# Patient Record
Sex: Male | Born: 1950 | Race: White | Hispanic: No | Marital: Married | Smoking: Former smoker
Health system: Southern US, Community
[De-identification: ages and names within clinical notes are randomized; demographics above are authoritative.]

## PROBLEM LIST (undated history)

## (undated) DIAGNOSIS — Q231 Congenital insufficiency of aortic valve: Secondary | ICD-10-CM

## (undated) DIAGNOSIS — R011 Cardiac murmur, unspecified: Secondary | ICD-10-CM

## (undated) DIAGNOSIS — N400 Enlarged prostate without lower urinary tract symptoms: Secondary | ICD-10-CM

## (undated) DIAGNOSIS — I4891 Unspecified atrial fibrillation: Secondary | ICD-10-CM

## (undated) HISTORY — DX: Benign prostatic hyperplasia without lower urinary tract symptoms: N40.0

## (undated) HISTORY — DX: Congenital insufficiency of aortic valve: Q23.1

## (undated) HISTORY — PX: TONSILLECTOMY: SUR1361

## (undated) HISTORY — DX: Unspecified atrial fibrillation: I48.91

## (undated) HISTORY — PX: TENDON REPAIR: SHX5111

## (undated) HISTORY — DX: Cardiac murmur, unspecified: R01.1

---

## 1971-03-05 HISTORY — PX: MENISCUS REPAIR: SHX5179

## 2001-06-14 HISTORY — PX: POLYPECTOMY: SHX149

## 2003-11-14 ENCOUNTER — Ambulatory Visit: Payer: Self-pay | Admitting: Internal Medicine

## 2004-01-01 ENCOUNTER — Ambulatory Visit: Payer: Self-pay | Admitting: Internal Medicine

## 2004-07-02 ENCOUNTER — Encounter: Payer: Self-pay | Admitting: Family Medicine

## 2005-02-24 ENCOUNTER — Ambulatory Visit: Payer: Self-pay | Admitting: Family Medicine

## 2005-03-30 ENCOUNTER — Ambulatory Visit: Payer: Self-pay | Admitting: Family Medicine

## 2005-05-06 ENCOUNTER — Ambulatory Visit: Payer: Self-pay | Admitting: Family Medicine

## 2005-08-21 ENCOUNTER — Ambulatory Visit: Payer: Self-pay | Admitting: Specialist

## 2005-09-30 ENCOUNTER — Ambulatory Visit: Payer: Self-pay | Admitting: Family Medicine

## 2005-10-06 ENCOUNTER — Ambulatory Visit: Payer: Self-pay | Admitting: Family Medicine

## 2005-10-13 LAB — HM COLONOSCOPY

## 2005-12-08 ENCOUNTER — Ambulatory Visit: Payer: Self-pay | Admitting: Gastroenterology

## 2006-01-12 ENCOUNTER — Ambulatory Visit: Payer: Self-pay | Admitting: Gastroenterology

## 2006-01-12 HISTORY — PX: UPPER GASTROINTESTINAL ENDOSCOPY: SHX188

## 2006-02-01 HISTORY — PX: COLONOSCOPY: SHX174

## 2006-02-11 HISTORY — PX: DOPPLER ECHOCARDIOGRAPHY: SHX263

## 2006-02-16 HISTORY — PX: OTHER SURGICAL HISTORY: SHX169

## 2006-07-12 ENCOUNTER — Ambulatory Visit: Payer: Self-pay | Admitting: Internal Medicine

## 2006-12-06 ENCOUNTER — Ambulatory Visit: Payer: Self-pay | Admitting: Internal Medicine

## 2007-01-03 ENCOUNTER — Ambulatory Visit: Payer: Self-pay | Admitting: Internal Medicine

## 2007-02-01 ENCOUNTER — Ambulatory Visit: Payer: Self-pay | Admitting: Internal Medicine

## 2008-07-29 ENCOUNTER — Encounter: Payer: Self-pay | Admitting: Family Medicine

## 2008-10-23 ENCOUNTER — Encounter: Payer: Self-pay | Admitting: Family Medicine

## 2008-10-23 DIAGNOSIS — D126 Benign neoplasm of colon, unspecified: Secondary | ICD-10-CM | POA: Insufficient documentation

## 2008-10-23 DIAGNOSIS — I4891 Unspecified atrial fibrillation: Secondary | ICD-10-CM | POA: Insufficient documentation

## 2008-10-23 DIAGNOSIS — N400 Enlarged prostate without lower urinary tract symptoms: Secondary | ICD-10-CM

## 2008-10-24 ENCOUNTER — Emergency Department: Payer: Self-pay | Admitting: Emergency Medicine

## 2008-10-24 ENCOUNTER — Encounter: Payer: Self-pay | Admitting: Family Medicine

## 2008-10-29 ENCOUNTER — Ambulatory Visit: Payer: Self-pay | Admitting: Family Medicine

## 2009-03-17 ENCOUNTER — Other Ambulatory Visit: Payer: Self-pay | Admitting: Internal Medicine

## 2009-08-12 ENCOUNTER — Encounter: Payer: Self-pay | Admitting: Family Medicine

## 2009-09-03 ENCOUNTER — Encounter (INDEPENDENT_AMBULATORY_CARE_PROVIDER_SITE_OTHER): Payer: Self-pay | Admitting: *Deleted

## 2009-09-04 ENCOUNTER — Ambulatory Visit: Payer: Self-pay | Admitting: Family Medicine

## 2009-09-04 DIAGNOSIS — L230 Allergic contact dermatitis due to metals: Secondary | ICD-10-CM

## 2009-09-04 DIAGNOSIS — M259 Joint disorder, unspecified: Secondary | ICD-10-CM | POA: Insufficient documentation

## 2009-09-08 ENCOUNTER — Ambulatory Visit: Payer: Self-pay | Admitting: Family Medicine

## 2009-09-08 DIAGNOSIS — M549 Dorsalgia, unspecified: Secondary | ICD-10-CM | POA: Insufficient documentation

## 2009-11-11 ENCOUNTER — Ambulatory Visit: Payer: Self-pay | Admitting: Internal Medicine

## 2009-12-04 ENCOUNTER — Ambulatory Visit: Payer: Self-pay | Admitting: Family Medicine

## 2009-12-04 DIAGNOSIS — R5383 Other fatigue: Secondary | ICD-10-CM

## 2009-12-04 DIAGNOSIS — R5381 Other malaise: Secondary | ICD-10-CM

## 2009-12-17 ENCOUNTER — Encounter: Payer: Self-pay | Admitting: Family Medicine

## 2010-03-03 NOTE — Letter (Signed)
Summary: Nadara Eaton letter  [place] [facility]  [address] [place], [place] [address]   Phone: [phone]  Fax: [phone]       09/03/2009 MRN: [id number]  MAIKA KACZMAREK [address] [place], [place]  [address]  Dear Mr. Lars [facility] [place], and [place] announce the retirement of Arta Silence, M.D., from full-time practice at the [facility] office effective July 31, 2009 and his plans of returning part-time.  It is important to Dr. Hetty Ely and to our practice that you understand that [facility] has seven physicians in our office for your health care needs.  We will continue to offer the same exceptional care that you have today.    Dr. Hetty Ely has spoken to many of you about his plans for retirement and returning part-time in the fall.   We will continue to work with you through the transition to schedule appointments for you in the office and meet the high standards that [place] is committed to.   Again, it is with great pleasure that we share the news that Dr. Hetty Ely will return to [facility] at [facility] in October of 2011 with a reduced schedule.    If you have any questions, or would like to request an appointment with one of our physicians, please call us at [phone] and press the option for Scheduling an appointment.  We take pleasure in providing you with excellent patient care and look forward to seeing you at your next office visit.  Our [facility] Physicians are:  Tillman Abide, M.D. Laurita Quint, M.D. Roxy Manns, M.D. Kerby Nora, M.D. Hannah Beat, M.D. Ruthe Mannan, M.D. We proudly welcomed Raechel Ache, M.D. and Eustaquio Boyden, M.D. to the practice in July/August 2011.  Sincerely,  [facility]

## 2010-03-03 NOTE — Assessment & Plan Note (Signed)
Summary: ? SPIDER BITE   Vital Signs:  Patient profile:   60 year old male Weight:      166 pounds Temp:     98.5 degrees F oral Pulse rate:   78 / minute Pulse rhythm:   regular BP sitting:   110 / 80  (left arm) Cuff size:   regular  Vitals Entered By: Selena Batten Dance CMA Duncan Dull) (November 11, 2009 4:01 PM) CC: check place on face   History of Present Illness: CC: check face  1 wk ago noticed brown spot on right cheek of face.  Yesterday brown spot fell off- scab.  did ooze some, clear fluid, no bleeding, no pus.  Has been using hot compress on.  + pruritis.  not bothering him otherwise.    Last 3 days having ST.  Today congestion.  No cough, no fevers.    Current Medications (verified): 1)  Multivitamins  Tabs (Multiple Vitamin) .... Take One By Mouth Daily 2)  Aspirin 81 Mg Tbec (Aspirin) .... Take 2 By Mouth Daily 3)  Ibuprofen 200 Mg Tabs (Ibuprofen) .... Take One By Mouth Two Times A Day As Needed 4)  Fish Oil 1000 Mg Caps (Omega-3 Fatty Acids) .... Take 3000 Mg As Needed 5)  Toprol Xl 25 Mg Xr24h-Tab (Metoprolol Succinate) .... Take 1/2 By Mouth Daily 6)  Vitamin D 1000 Unit Tabs (Cholecalciferol) .... 2-3 By Mouth Once Daily  Allergies: 1)  ! Penicillin V Potassium (Penicillin V Potassium) 2)  ! Avelox (Moxifloxacin Hcl)  Past History:  Past Medical History: Last updated: 10/23/2008 Benign prostatic hypertrophy PMH-FH-SH reviewed for relevance  Review of Systems       per HPI  Physical Exam  General:  Well-developed,well-nourished,in no acute distress; alert,appropriate and cooperative throughout examination Head:  Normocephalic and atraumatic without obvious abnormalities. No apparent alopecia or balding. Mild thinning of hair. Eyes:  No corneal or conjunctival inflammation noted. EOMI. Perrla.  Ears:  External ear exam shows no significant lesions or deformities.  Otoscopic examination reveals clear canals, tympanic membranes are intact bilaterally without  bulging, retraction, inflammation or discharge. Hearing is grossly normal bilaterally. Nose:  External nasal examination shows no deformity or inflammation. Nasal mucosa are pink and moist without lesions or exudates. Mouth:  Oral mucosa and oropharynx without lesions or exudates.  Teeth in good repair. Neck:  No deformities, masses, or tenderness noted.  no LAD Skin:  small red spot right cheek, no drainage, slight erythema surrounding lesion and spreading linearly inferiorly from lesion   Impression & Recommendations:  Problem # 1:  SKIN LESION (ICD-709.9) reassured, benign appearing.  likely redness irritation from scab removal.  rec continue to monitor for nwo, consider abx ointment while spot heals, warm compresses.  return if red flags.  Problem # 2:  VIRAL URI (ICD-465.9)  Instructed on symptomatic treatment. Call if symptoms persist or worsen.   His updated medication list for this problem includes:    Aspirin 81 Mg Tbec (Aspirin) .Marland Kitchen... Take 2 by mouth daily    Ibuprofen 200 Mg Tabs (Ibuprofen) .Marland Kitchen... Take one by mouth two times a day as needed  Complete Medication List: 1)  Multivitamins Tabs (Multiple vitamin) .... Take one by mouth daily 2)  Aspirin 81 Mg Tbec (Aspirin) .... Take 2 by mouth daily 3)  Ibuprofen 200 Mg Tabs (Ibuprofen) .... Take one by mouth two times a day as needed 4)  Fish Oil 1000 Mg Caps (Omega-3 fatty acids) .... Take 3000 mg as needed 5)  Toprol Xl 25 Mg Xr24h-tab (Metoprolol succinate) .... Take 1/2 by mouth daily 6)  Vitamin D 1000 Unit Tabs (Cholecalciferol) .... 2-3 by mouth once daily  Patient Instructions: 1)  you are doing all the right things.  continue warm compresses, consider abx ointment. 2)  For congestion - get plenty of rest, lots of fluids. 3)  Call us if not improving as expected.  Current Allergies (reviewed today): ! PENICILLIN V POTASSIUM (PENICILLIN V POTASSIUM) ! AVELOX (MOXIFLOXACIN HCL)

## 2010-03-03 NOTE — Assessment & Plan Note (Signed)
Summary: SOMETHING ON FOOT/DISCOLORATION ON NOSE/DLO   Vital Signs:  Patient profile:   60 year old male Height:      68.5 inches Weight:      164.0 pounds BMI:     24.66 Temp:     98.7 degrees F oral Pulse rate:   76 / minute Pulse rhythm:   regular BP sitting:   110 / 78  (left arm) Cuff size:   regular  Vitals Entered By: Benny Lennert CMA Duncan Dull) (September 04, 2009 8:29 AM)  History of Present Illness: Chief complaint multiple issues   Arch of left foot..present over past few years. For several years he has had soreness when putting pressure on it.  He coming in today beacuse it has gradually become more sore. No color change, no rash. Does not bother him much.   No recent change in activity.  Where glasses rest..has noted discoloration  and irritation. Intermittant. No itching..but burns some. No scaling/flaking. No family or personal history of skin problems or skin cancer.      Problems Prior to Update: 1)  Ruq Pain Radiating To Mid Back  (ICD-789.01) 2)  Paroxysmal Atrial Fibrillation  (ICD-427.31) 3)  Colonic Polyps  (ICD-211.3) 4)  Benign Prostatic Hypertrophy  (ICD-600.00)  Current Medications (verified): 1)  Multivitamins  Tabs (Multiple Vitamin) .... Take One By Mouth Daily 2)  Aspirin 81 Mg Tbec (Aspirin) .... Take 2 By Mouth Daily 3)  Ibuprofen 200 Mg Tabs (Ibuprofen) .... Take One By Mouth Two Times A Day As Needed 4)  Fish Oil 1000 Mg Caps (Omega-3 Fatty Acids) .... Take 3000 Mg As Needed 5)  Toprol Xl 25 Mg Xr24h-Tab (Metoprolol Succinate) .... Take 1/2 By Mouth Daily  Allergies: 1)  ! Penicillin V Potassium (Penicillin V Potassium) 2)  ! Avelox (Moxifloxacin Hcl)  Past History:  Past medical, surgical, family and social histories (including risk factors) reviewed, and no changes noted (except as noted below).  Past Medical History: Reviewed history from 10/23/2008 and no changes required. Benign prostatic hypertrophy  Past Surgical  History: Reviewed history from 10/23/2008 and no changes required. Tonsillectomy - at 61 years old Left little finger tendon repair at 60 years of age Left knee open medial meniscal repair 02/73 Colon polypectomy, int hemorrhoids 06/14/01 UGI - 01/12/06 ETT Myoview- mod inferior defect,  neg  ischemia 02/16/06 ECHO NML EF 60% 02/11/06  Family History: Reviewed history from 10/23/2008 and no changes required. Father:  Alive, S/P heart attack with bypass graft at 60 years of age, shortness of breath, is a retired Company secretary in [place] with no significant chest symptoms, and he has had generalized pruritus  Mother: Deceased at age of 60 with Alzheimer's, depression, and some complication for which she bled out in the hospital Siblings: Three brothers, all alive, one with bladder cancer, one with depression and one alive and well.  One sister alive and well CV + father, PGF deceased with MI HBP - none DM +  Niece Prostate cancer - bladder cancer + brother Colon Cancer + self polyp Depression + Mother, brother ETOH/Drug Abuse - none + Smoke PGM  Social History: Reviewed history from 10/23/2008 and no changes required. 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 CV:  Denies chest pain or discomfort. Resp:  Denies shortness of breath. GI:  Denies abdominal pain. GU:  Denies dysuria.  Physical Exam  General:  Well-developed,well-nourished,in no  acute distress; alert,appropriate and cooperative throughout examination Eyes:  No corneal or conjunctival inflammation noted. EOMI. Perrla. Funduscopic exam benign, without hemorrhages, exudates or papilledema. Vision grossly normal. Ears:  External ear exam shows no significant lesions or deformities.  Otoscopic examination reveals clear canals, tympanic membranes are intact bilaterally without bulging, retraction, inflammation or discharge.  Hearing is grossly normal bilaterally. Nose:  External nasal examination shows no deformity or inflammation. Nasal mucosa are pink and moist without lesions or exudates. Mouth:  Oral mucosa and oropharynx without lesions or exudates.  Teeth in good repair. Lungs:  Normal respiratory effort, chest expands symmetrically. Lungs are clear to auscultation, no crackles or wheezes. Heart:  Normal rate and regular rhythm. S1 and S2 normal without gallop, murmur, click, rub or other extra sounds. Pulses:  R and L posterior tibial pulses are full and equal bilaterally  Extremities:  no edmea  Skin:  1 cm nodule in central left plantar fascia.Marland Kitchennon mobile except when stretching plantar fascia  non tender  no redness  no skin discoloration  Right bridge of nse...glassess are missing nose guard on right, metal rough and worn...metal sitting on skin on right not on left area of dicoloration, redish purple at contact area.    Impression & Recommendations:  Problem # 1:  CONTACT DERMATITIS DUE TO METALS (ZOX-096.04) Apply OTC cortisone cream two times a day. Fix glasses or get new ones to stop contact against skin.   Problem # 2:  OTHER SYMPTOMS REFERABLE TO ANKLE AND FOOT JOINT (ICD-719.67) Nodule on plantar fascia...no sign of infection. Limiting pt minimally. If increasing in size or tenderness...refer to podiatry. Pt not interested in referral at this time.   Complete Medication List: 1)  Multivitamins Tabs (Multiple vitamin) .... Take one by mouth daily 2)  Aspirin 81 Mg Tbec (Aspirin) .... Take 2 by mouth daily 3)  Ibuprofen 200 Mg Tabs (Ibuprofen) .... Take one by mouth two times a day as needed 4)  Fish Oil 1000 Mg Caps (Omega-3 fatty acids) .... Take 3000 mg as needed 5)  Toprol Xl 25 Mg Xr24h-tab (Metoprolol succinate) .... Take 1/2 by mouth daily  Patient Instructions: 1)  Call if interested in podiatry referral.. 2)  Call if foot mass increasing in size or tenderness. 3)  Apply cortisone  10 to area on nose twice daily..for 2 weeks. 4)   Fix glasses so not resting on nose.  Current Allergies (reviewed today): ! PENICILLIN V POTASSIUM (PENICILLIN V POTASSIUM) ! AVELOX (MOXIFLOXACIN HCL)

## 2010-03-03 NOTE — Assessment & Plan Note (Signed)
Summary: ? kidney infection/nt   Vital Signs:  Patient profile:   60 year old male Height:      68.5 inches Weight:      164.6 pounds BMI:     24.75 Temp:     97.6 degrees F oral Pulse rate:   72 / minute Pulse rhythm:   regular BP sitting:   110 / 68  (left arm) Cuff size:   regular  Vitals Entered By: Benny Lennert CMA Duncan Dull) (September 08, 2009 12:30 PM)  History of Present Illness: Chief complaint Back pain  60 year old male:  On either side of spine, erects having some pain there  Last week, in the middle of his back, some pain getting in and out of his car. Took some advil. Over the weekend.   Out of the gym all week. Went to the gym    no dysuria  REVIEW OF SYSTEMS  GEN: No systemic complaints, no fevers, chills, sweats, or other acute illnesses MSK: Detailed in the HPI GI: tolerating PO intake without difficulty Neuro: No numbness, parasthesias, or tingling associated. Otherwise the pertinent positives of the ROS are noted above.    Allergies: 1)  ! Penicillin V Potassium (Penicillin V Potassium) 2)  ! Avelox (Moxifloxacin Hcl)  Past History:  Past medical, surgical, family and social histories (including risk factors) reviewed, and no changes noted (except as noted below).  Past Medical History: Reviewed history from 10/23/2008 and no changes required. Benign prostatic hypertrophy  Past Surgical History: Reviewed history from 10/23/2008 and no changes required. Tonsillectomy - at 60 years old Left little finger tendon repair at 60 years of age Left knee open medial meniscal repair 02/73 Colon polypectomy, int hemorrhoids 06/14/01 UGI - 01/12/06 ETT Myoview- mod inferior defect,  neg  ischemia 02/16/06 ECHO NML EF 60% 02/11/06  Family History: Reviewed history from 10/23/2008 and no changes required. Father:  Alive, S/P heart attack with bypass graft at 60 years of age, shortness of breath, is a retired Company secretary in [place] with no significant  chest symptoms, and he has had generalized pruritus  Mother: Deceased at age of 80 with Alzheimer's, depression, and some complication for which she bled out in the hospital Siblings: Three brothers, all alive, one with bladder cancer, one with depression and one alive and well.  One sister alive and well CV + father, PGF deceased with MI HBP - none DM +  Niece Prostate cancer - bladder cancer + brother Colon Cancer + self polyp Depression + Mother, brother ETOH/Drug Abuse - none + Smoke PGM  Social History: Reviewed history from 10/23/2008 and no changes required. 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  Ears, externally no deformities Pulm: Breathing comfortably in no respiratory distress Range of motion at  the waist: Flexion:no pain Extension: no pain Rotation: no pain  No echymosis or edema Rises to examination table with mild difficulty Gait: minimally antalgic  Inspection/Deformity: N Paraspinus T: Y  B Ankle Dorsiflexion (L5,4): 5/5 B Great Toe Dorsiflexion (L5,4): 5/5 Heel Walk (L5): WNL Toe Walk (S1): WNL Rise/Squat (L4): WNL  SENSORY B Medial Foot (L4): WNL B Dorsum (L5): WNL B Lateral (S1): WNL Light Touch: WNL Pinprick: WNL  REFLEXES Knee (L4): 2+ Ankle (S1): 2+  B SLR, seated: neg B SLR, supine: neg  B FABER: neg B Reverse FABER: neg B Greater Troch: NT B Log Roll: neg B Stork: NT B Sciatic Notch: NT Leg Lengths: equal    Impression & Recommendations:  Problem # 1:  BACK PAIN (ICD-724.5) >25 minutes spent in face to face time with patient, >50% spent in counselling or coordination of care : reviewed anatomy, discussion of rehab protocols from [facility] for  back and core and alteration of lifting routine.  His updated medication list for this problem includes:    Aspirin 81 Mg Tbec (Aspirin) .Marland Kitchen... Take 2 by mouth daily    Ibuprofen 200 Mg Tabs (Ibuprofen) .Marland Kitchen... Take one by mouth two times a day as needed  Complete Medication List: 1)  Multivitamins Tabs (Multiple vitamin) .... Take one by mouth daily 2)  Aspirin 81 Mg Tbec (Aspirin) .... Take 2 by mouth daily 3)  Ibuprofen 200 Mg Tabs (Ibuprofen) .... Take one by mouth two times a day as needed 4)  Fish Oil 1000 Mg Caps (Omega-3 fatty acids) .... Take 3000 mg as needed 5)  Toprol Xl 25 Mg Xr24h-tab (Metoprolol succinate) .... Take 1/2 by mouth daily  Current Allergies (reviewed today): ! PENICILLIN V POTASSIUM (PENICILLIN V POTASSIUM) ! AVELOX (MOXIFLOXACIN HCL)

## 2010-03-03 NOTE — Assessment & Plan Note (Signed)
Summary: FREQUENT URINATION @ NIGHT/CLE   Vital Signs:  Patient profile:   60 year old male Weight:      163 pounds Temp:     98.0 degrees F oral Pulse rate:   60 / minute Pulse rhythm:   irregular BP sitting:   110 / 70  (left arm) Cuff size:   regular  Vitals Entered By: Sydell Axon LPN (December 04, 2009 11:13 AM) CC: Frequent urination at night   History of Present Illness: Pt here for progressive urination at night. He has PSA checked regularly at [facility] due to his wife's employment. He gets up at night often at times. He had fatigue, lack of enthusiasm, some libido issues and some erectile difficulties at times. His last PE was here in early 08. He is healthy and active and prefers not to have a PE.  He is amenable to bloddwork through [facility]. 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 Take One By Mouth Daily 2)  Aspirin 81 Mg Tbec (Aspirin) .... Take 2 By Mouth Daily 3)  Ibuprofen 200 Mg Tabs (Ibuprofen) .... Take One By Mouth Two Times A Day As Needed 4)  Fish Oil 1000 Mg Caps (Omega-3 Fatty Acids) .... Take 3000 Mg As Needed 5)  Toprol Xl 25 Mg Xr24h-Tab (Metoprolol Succinate) .... Take 1/2 By Mouth Daily 6)  Vitamin D 1000 Unit Tabs (Cholecalciferol) .... 2-3 By Mouth Once Daily  Allergies: 1)  ! Penicillin V Potassium (Penicillin V Potassium) 2)  ! Avelox (Moxifloxacin Hcl)  Physical Exam  General:  Well-developed,well-nourished,in no acute distress; alert,appropriate and  cooperative throughout examination Head:  Normocephalic and atraumatic without obvious abnormalities. No apparent alopecia or balding. Mild thinning of hair. Eyes:  No corneal or conjunctival inflammation noted. EOMI. Perrla.  Ears:  External ear exam shows no significant lesions or deformities.  Otoscopic examination reveals clear canals, tympanic membranes are intact bilaterally without bulging, retraction, inflammation or discharge. Hearing is grossly normal bilaterally. Nose:  External nasal examination shows no deformity or inflammation. Nasal mucosa are pink and moist without lesions or exudates. Mouth:  Oral mucosa and oropharynx without lesions or exudates.  Teeth in good repair. Neck:  No deformities, masses, or tenderness noted.  no LAD Chest Wall:  No deformities, masses, tenderness or gynecomastia noted. Lungs:  Normal respiratory effort, chest expands symmetrically. Lungs are clear to auscultation, no crackles or wheezes. Heart:  Normal rate and regular rhythm. S1 and S2 normal without gallop, murmur, click, rub or other extra sounds. Rectal:  No external abnormalities noted. Normal sphincter tone. No rectal masses or tenderness. G neg. Genitalia:  Testes bilaterally descended without nodularity, tenderness or masses. No scrotal masses or lesions. No penis lesions or urethral discharge. Prostate:  Prostate gland firm and smooth, raphe intact, no enlargement, nodularity, tenderness, mass, asymmetry or induration. 10-20gms.   Impression & Recommendations:  Problem # 1:  BENIGN PROSTATIC HYPERTROPHY, WITH MILD URINARY OBSTRUCTION (ICD-600.01) Assessment New Discussed this  at length, including sxs, trmyt, needs, cancer risk. Does not seem to require trmt at this time.  Problem # 2:  FATIGUE (ICD-780.79) Assessment: New Constellation of fatigue, libido problems, erection difficulties, enthusiasm, etc....sounds like test deficiency. Will test but refer if trmt needed and  desired.  Complete Medication List: 1)  Multivitamins Tabs (Multiple vitamin) .... Take one by mouth daily 2)  Aspirin 81 Mg Tbec (Aspirin) .... Take 2 by mouth daily 3)  Ibuprofen 200 Mg Tabs (Ibuprofen) .... Take one by mouth two times a day as needed 4)  Fish Oil 1000 Mg Caps (Omega-3 fatty acids) .... Take 3000 mg as needed 5)  Toprol Xl 25 Mg Xr24h-tab (Metoprolol succinate) .... Take 1/2 by mouth daily 6)  Vitamin D 1000 Unit Tabs (Cholecalciferol) .... 2-3 by mouth once daily  Patient Instructions: 1)  Pt taking labs to [facility]. Will call with results. 2)  If test repl needed and desired, refer to Urology.   Orders Added: 1)  Est. Patient Level III [[address]]    Current Allergies (reviewed today): ! PENICILLIN V POTASSIUM (PENICILLIN V POTASSIUM) ! AVELOX (MOXIFLOXACIN HCL)

## 2010-06-09 ENCOUNTER — Encounter: Payer: Self-pay | Admitting: Family Medicine

## 2010-06-10 ENCOUNTER — Ambulatory Visit (INDEPENDENT_AMBULATORY_CARE_PROVIDER_SITE_OTHER): Payer: PRIVATE HEALTH INSURANCE | Admitting: Family Medicine

## 2010-06-10 ENCOUNTER — Encounter: Payer: Self-pay | Admitting: Family Medicine

## 2010-06-10 VITALS — BP 106/70 | HR 80 | Temp 98.3°F | Wt 167.1 lb

## 2010-06-10 DIAGNOSIS — R109 Unspecified abdominal pain: Secondary | ICD-10-CM | POA: Insufficient documentation

## 2010-06-10 NOTE — Progress Notes (Signed)
Last 2 mornings with upper abd pain. Epigastrum was ttp.  Pain would radiate upward and outward with deep breath.  Already sleeping with head elevated.  Pain resolved after getting up and moving.    1-2 weeks before 'my stomach didn't feel right.'  No change in BMs except for inc in stool number/day.  Prev with longstanding GI sx.  Had seen GI, colonoscopy/EGD done 01/12/06, unremarkable.    No FVD. Very rare nausea over the last month, brief, self resolved.  No weight loss, food aversion, hematochezia, hematemesis.   On ASA for AF.  No other NSAIDS.    Meds, vitals, and allergies reviewed.   ROS: See HPI.  Otherwise, noncontributory.  nad ncat Mmm Neck supple rrr ctab abd soft, epigastrum and RUQ minimally ttp, o/w not ttp, no rebound

## 2010-06-10 NOTE — Patient Instructions (Signed)
I would take some tums in the meantime.  If the pain persists, call back and ask for [facility] about scheduling the ultrasound of your abdomen.  Take care.

## 2010-06-10 NOTE — Assessment & Plan Note (Addendum)
>  25 min spent with face to face with patient.  D/w pt about options.  I would take prn tums for likely gerd and call back if needed.  We can set up abd u/s to eval gall bladder, though I don't expect sig pathology.  Hold on labs for now as exam is benign.  He agrees.

## 2010-06-11 ENCOUNTER — Telehealth: Payer: Self-pay | Admitting: Family Medicine

## 2010-06-11 DIAGNOSIS — R109 Unspecified abdominal pain: Secondary | ICD-10-CM

## 2010-06-11 NOTE — Assessment & Plan Note (Signed)
Send for u/s.

## 2010-06-11 NOTE — Telephone Encounter (Signed)
Abd [place] set up at [facility] on 06/12/2010 signed order was faxed on 06/11/2010. Also spoke with scheduler from [facility]. Patient called and aware of the appt. MK

## 2010-06-11 NOTE — Telephone Encounter (Signed)
Dr Para March, This patient called me to ask you to put an order in for an Abdominal US to be done at [facility] which will be an external order. The number to call is [phone] and their fax is [phone]. He wanted to get this done tomorrow am if he could. Please place the order and let me know when you do. His cell# [phone]. Shirlee Limerick

## 2010-06-12 ENCOUNTER — Telehealth: Payer: Self-pay | Admitting: Family Medicine

## 2010-06-12 DIAGNOSIS — R109 Unspecified abdominal pain: Secondary | ICD-10-CM

## 2010-06-12 NOTE — Telephone Encounter (Signed)
Call from [facility]- GB wall thickened but no CBD diliation.  No stones.  D/w pt.  No fever.  Pain is some better now.  If fever or sig inc in pain, then to ER.  Avoid fatty meals in meantime and proceed with HIDA scan.  He understood and agreed.

## 2010-06-12 NOTE — Telephone Encounter (Signed)
Called Dr. Vear Clock regarding call report and she stated that she has already spoken with Dr. Para March.

## 2010-06-15 ENCOUNTER — Other Ambulatory Visit: Payer: Self-pay | Admitting: Family Medicine

## 2010-06-15 ENCOUNTER — Other Ambulatory Visit: Payer: Self-pay | Admitting: Internal Medicine

## 2010-06-15 ENCOUNTER — Inpatient Hospital Stay (HOSPITAL_COMMUNITY): Admission: RE | Admit: 2010-06-15 | Payer: PRIVATE HEALTH INSURANCE | Source: Ambulatory Visit

## 2010-06-15 DIAGNOSIS — R932 Abnormal findings on diagnostic imaging of liver and biliary tract: Secondary | ICD-10-CM

## 2010-06-16 NOTE — Letter (Signed)
February 01, 2007    Don Warner D. Juliann Pares, MD.  [address], [address].  [place], [place], [address].   RE:  ACXEL, DINGEE  MRN:  [id number]  /  DOB:  07-13-1950   Dear Gerda Diss:   It was a pleasure to see your patient, Don Warner, today in  consultation regarding his atrial fibrillation.  As you recall, I had  had the pleasure of seeing him for you about three or four years ago for  atrial fibrillation, and his episodes have become increasingly frequent.  They are associated with impaired exercise tolerance, shortness of  breath, and a weak heart.  There is no significant chest discomfort.   His thromboembolic risk factors are broadly negative.   He works as a Systems analyst and had noted no change in his exercise  tolerance apart from the above.   His past medical history in addition to the above is notable for a  Dupuytren's contracture for which he anticipates undergoing surgery.  He  has also had surgery on his left knee.   He also describes an atypical pain that goes fleetingly into his neck.  You undertook carotid Doppler's and they apparently were negative.   His medications include aspirin, fish oil, and creatine.   He is ALLERGIC TO AVELOX AND PENICILLIN.   He is married, he has three children, 91, 58, and 57, he does not use  cigarettes, alcohol, or recreational drugs.   On examination, he is a middle-aged, Caucasian male appearing his stated  age of 88.  His blood pressure was 106/69, his pulse was 59, his weight  was 164.  His HEENT exam demonstrated no icterus or xanthomata.  The  neck veins were flat.  The carotids were brisk and full bilaterally  without bruits.  The back was without kyphosis or scoliosis.  His lungs  were clear.  Heart sounds were regular.  There was an early systolic  murmur.  There was no S4.  The abdomen was soft with active bowel sounds  without midline pulsation or hepatomegaly.  Femoral pulses were 2+,  distal pulses were intact,  there was no clubbing, cyanosis, or edema.  The neurological exam was grossly normal.  His skin was warm and dry.   Electrocardiogram dated today demonstrated sinus rhythm at 51 with  intervals of .18/.08/.41, the axis was 80 degrees, there was a prominent  P-wave in lead V1 consistent with left atrial enlargement.   IMPRESSION:  1. Paroxysmal atrial fibrillation.  2. Bradycardia.   DISCUSSION:  Don Warner, Mr. Massing has paroxysmal atrial fibrillation,  which is increasingly frequent and increasingly long so that it is  disrupting his life quite considerably.  Treatment options would include  AV nodal blocking agents and/or antiarrhythmic drug therapy typically as  a first step.  Both of these, particularly because once the agent  requires the concomitant use of an AV nodal blocking agent likely  aggravates his tendency towards bradycardia.   His primary question is whether he is a candidate for catheter ablation  of his atrial fibrillation.  I reviewed with him the procedure and  guidelines, which suggest that it is currently recommended to follow  antiarrhythmic drugs.  However, given his bradycardia and the likelihood  that the addition of an antiarrhythmic drug would result in untoward  consequences, I think it is reasonable to have him see one of the atrial  fibrillation ablation doctors as a possible candidate for primary  therapy.   I told him I  would touch base with Dr. Rhona Leavens at [facility], who could  review with him both the institution's data as well as his own  experience and think with the patient as to whether he would be  appropriate to consider for ablation bypassing the other drug trial.  Tikosyn as a drug  would be one that would not likely affect his AV node or his  bradycardia, and it may be that this would be recommended; we will have  to wait and see.   Don Warner, hope you had a Happy New Year.    Sincerely,      Duke Salvia, MD, [facility]  Electronically  Signed    SCK/MedQ  DD: 02/01/2007  DT: 02/01/2007  Job #: [id number]   CC:    Arta Silence, MD

## 2010-06-23 ENCOUNTER — Encounter: Payer: Self-pay | Admitting: Family Medicine

## 2010-06-30 ENCOUNTER — Telehealth: Payer: Self-pay | Admitting: *Deleted

## 2010-06-30 NOTE — Telephone Encounter (Signed)
Patient says that he went for his HIDA scan on 5-18 and he still hasn't heard back.

## 2010-07-01 NOTE — Telephone Encounter (Signed)
Call placed to patient at [phone], he was informed per Dr Para March instructions.  Patient states that he is aware that is may happen again in the future, but currently his symptoms have resolved. He states that in the event that he may need a surgeon he has one line up Dr Rosalita Levan in [place]. He stated that this is someone that he personally knows and has consulted with him regarding his symptoms.(his wife works for this physician). He declines a referral at this time and has decided to follow up on a as needed basis

## 2010-07-01 NOTE — Telephone Encounter (Signed)
I call [facility] and they are going to be faxing over the results.

## 2010-07-01 NOTE — Telephone Encounter (Signed)
Please call pt.  I am unsure why this result was held up.  The scan was normal.  This is reassuring.  It is not mandatory for him to have his gallbladder removed.  It may be reasonable to have patient consider outpatient follow up with general surgery, to discuss the options of gall bladder surgery.  The other option would be to continue to follow this clinically (ie as long as he has no pain, then we don't have to do anything else).  I think it is reasonable to do either at this point.  Please let me know if I need to put in the referral for general surgery.  Thanks.

## 2010-07-03 ENCOUNTER — Encounter: Payer: Self-pay | Admitting: Internal Medicine

## 2010-10-08 ENCOUNTER — Encounter: Payer: Self-pay | Admitting: Internal Medicine

## 2010-10-08 ENCOUNTER — Ambulatory Visit (INDEPENDENT_AMBULATORY_CARE_PROVIDER_SITE_OTHER): Payer: PRIVATE HEALTH INSURANCE | Admitting: Internal Medicine

## 2010-10-08 VITALS — BP 121/85 | HR 94 | Temp 97.6°F | Resp 16 | Ht 70.0 in | Wt 165.5 lb

## 2010-10-08 DIAGNOSIS — D126 Benign neoplasm of colon, unspecified: Secondary | ICD-10-CM

## 2010-10-08 DIAGNOSIS — R5383 Other fatigue: Secondary | ICD-10-CM

## 2010-10-08 DIAGNOSIS — R5381 Other malaise: Secondary | ICD-10-CM

## 2010-10-08 DIAGNOSIS — N400 Enlarged prostate without lower urinary tract symptoms: Secondary | ICD-10-CM

## 2010-10-08 DIAGNOSIS — I4891 Unspecified atrial fibrillation: Secondary | ICD-10-CM

## 2010-10-08 NOTE — Progress Notes (Signed)
  Subjective:    Patient ID: Don Warner, male    DOB: 21-Sep-1950, 60 y.o.   MRN: [id number]  HPI Mr. Garden is a healthy 60 yo male referred by wife Bonita Quin for establishment of primary care.  No acute issues,  But remains frustrated with his inability to perform physically at the level he used to prior to his diagnosis of atrial fibrillation.  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 Has occasional palpitations but denies fluid retention, orthopnea and chest pain .     Review of Systems  Constitutional: Negative for fever, chills, diaphoresis, activity change, appetite change, fatigue and unexpected weight change.  HENT: Negative for hearing loss, ear pain, nosebleeds, congestion, sore throat, facial swelling, rhinorrhea, sneezing, drooling, mouth sores, trouble swallowing, neck pain, neck stiffness, dental problem, voice change, postnasal drip, sinus pressure, tinnitus and ear discharge.   Eyes: Negative for photophobia, pain, discharge, redness, itching and visual disturbance.  Respiratory: Negative for apnea, cough, choking, chest tightness, shortness of breath, wheezing and stridor.   Cardiovascular: Positive for palpitations. Negative for chest pain and leg swelling.  Gastrointestinal: Negative for nausea, vomiting, abdominal pain, diarrhea, constipation, blood in stool, abdominal distention, anal bleeding and rectal pain.  Genitourinary: Negative for dysuria, urgency, frequency, hematuria, flank pain, decreased urine volume, scrotal swelling, difficulty urinating and testicular pain.  Musculoskeletal: Negative for myalgias, back pain, joint swelling, arthralgias and gait problem.  Skin: Negative for color change, rash and wound.  Neurological: Negative for dizziness, tremors, seizures, syncope, speech difficulty, weakness, light-headedness, numbness and headaches.  Psychiatric/Behavioral: Negative  for suicidal ideas, hallucinations, behavioral problems, confusion, sleep disturbance, dysphoric mood, decreased concentration and agitation. The patient is not nervous/anxious.        Objective:   Physical Exam  Constitutional: He is oriented to person, place, and time.  HENT:  Head: Normocephalic and atraumatic.  Mouth/Throat: Oropharynx is clear and moist.  Eyes: Conjunctivae and EOM are normal.  Neck: Normal range of motion. Neck supple. No JVD present. No thyromegaly present.  Cardiovascular: Normal rate and normal heart sounds.  An irregularly irregular rhythm present.  Pulmonary/Chest: Effort normal and breath sounds normal. He has no wheezes. He has no rales.  Abdominal: Soft. Bowel sounds are normal. He exhibits no mass. There is no tenderness. There is no rebound.  Musculoskeletal: Normal range of motion. He exhibits no edema.  Neurological: He is alert and oriented to person, place, and time.  Skin: Skin is warm and dry.  Psychiatric: He has a normal mood and affect.          Assessment & Plan:

## 2010-10-09 NOTE — Assessment & Plan Note (Addendum)
With history of polypectomy in 2003.  Reportedly Up to date on surveillance but need records.

## 2010-10-09 NOTE — Assessment & Plan Note (Addendum)
Not requring medications.  PSA is due

## 2010-10-09 NOTE — Assessment & Plan Note (Signed)
Controlled with low dose metoprolol, occasionally aggravated by inherent bradycardia due to prior excellent physical conditioning.  He does not desire anticoagulation due to his active lifestyle, but takes 2 aspirin daily.

## 2010-11-04 ENCOUNTER — Telehealth: Payer: Self-pay | Admitting: Internal Medicine

## 2010-11-04 ENCOUNTER — Other Ambulatory Visit: Payer: Self-pay | Admitting: Internal Medicine

## 2010-11-04 NOTE — Telephone Encounter (Signed)
Cell [phone] Pt would like to get his vit d level checked.  He has an order for [facility] order date 10/08/10 Pt is going to get these labs done at [facility] in   Please advise pt

## 2010-11-12 ENCOUNTER — Telehealth: Payer: Self-pay | Admitting: Internal Medicine

## 2010-11-12 NOTE — Telephone Encounter (Signed)
c [phone]  Please call when the paper work for [facility] is done so the [facility] can go to [facility] to get labs  He left this last week

## 2010-11-17 NOTE — Telephone Encounter (Signed)
Left message for patient to return my call.

## 2010-11-18 ENCOUNTER — Other Ambulatory Visit: Payer: Self-pay | Admitting: Internal Medicine

## 2010-11-18 NOTE — Telephone Encounter (Signed)
Patient came in and picked up orders for labs.

## 2010-12-16 ENCOUNTER — Telehealth: Payer: Self-pay | Admitting: Internal Medicine

## 2010-12-16 MED ORDER — ERGOCALCIFEROL 1.25 MG (50000 UT) PO CAPS: 50000.0000 [IU] | ORAL_CAPSULE | ORAL | Status: DC

## 2010-12-16 NOTE — Telephone Encounter (Signed)
His vitamin d was low at 24 (rx Drisdol 50,000 units once a week for one month, then start otc vit d 1000 units daily.  I wil update EPIC withrx.   his other labs were normal including thyroid

## 2010-12-16 NOTE — Telephone Encounter (Signed)
Notified patient of results 

## 2010-12-23 ENCOUNTER — Encounter: Payer: Self-pay | Admitting: Internal Medicine

## 2011-04-16 ENCOUNTER — Ambulatory Visit (INDEPENDENT_AMBULATORY_CARE_PROVIDER_SITE_OTHER): Payer: PRIVATE HEALTH INSURANCE | Admitting: Internal Medicine

## 2011-04-16 ENCOUNTER — Encounter: Payer: Self-pay | Admitting: Internal Medicine

## 2011-04-16 VITALS — BP 110/74 | HR 86 | Temp 98.1°F | Resp 16 | Wt 170.5 lb

## 2011-04-16 DIAGNOSIS — M25561 Pain in right knee: Secondary | ICD-10-CM

## 2011-04-16 DIAGNOSIS — M25569 Pain in unspecified knee: Secondary | ICD-10-CM

## 2011-04-16 NOTE — Progress Notes (Deleted)
  Subjective:    Patient ID: Don Warner, male    DOB: December 05, 1950, 61 y.o.   MRN: [id number]  HPI  10 day history of right knee pain ,  Limited flexion,  Fullness/bulge in the popliteal space ,  Has  Been avoiding squats and lunges. Not giving way .   Occasional pain on the lateral side     Review of Systems     Objective:   Physical Exam        Assessment & Plan:

## 2011-04-16 NOTE — Patient Instructions (Addendum)
You can increase your ibuprofen to 400  Or 600 mg up to every 8 hours .,  ice your knee for 15   minutes    We will schedule an ultrasound to rule out a baker's cyst

## 2011-04-18 ENCOUNTER — Encounter: Payer: Self-pay | Admitting: Internal Medicine

## 2011-04-18 NOTE — Progress Notes (Signed)
Patient ID: Don Warner, male   DOB: [birth year], 61 y.o.   MRN: [id number]  Patient Active Problem List  Diagnoses  . COLONIC POLYPS  . PAROXYSMAL ATRIAL FIBRILLATION  . BENIGN PROSTATIC HYPERTROPHY  . CONTACT DERMATITIS DUE TO METALS  . OTHER SYMPTOMS REFERABLE TO ANKLE AND FOOT JOINT  . BACK PAIN  . FATIGUE  . Abdominal  pain, other specified site    Subjective:  CC:   Chief Complaint  Patient presents with  . Knee Pain    right    HPI:   Don Warner is a 61 y.o. male who presents 10 day history of right knee pain.  He notes Limited flexion and a cessation of Fullness/bulge in the popliteal space.  He works out daily but has been avoiding  [employer] and lunges to aggravate his pain he denies a sensation of the knee giving way.  He notes occasional pain on the lateral side  the knee which radiates to the lateral calf but does not think it is associated with activity. He denies any numbness or tingling. He has had no recent travel or prolonged immobilization. He is not a long-distance runner. He tried taking one ibuprofen which did ameliorate the pain somewhat.   Review of Systems:  Pertinent review of systems addressed in the HPI,    The following portions of the patient's history were reviewed and updated as appropriate: Allergies, current medications, and problem list.  Past Medical History  Diagnosis Date  . BPH (benign prostatic hypertrophy)   . Bicuspid aortic valve   . Arrhythmia     chronic atrial fibrillation   Past Surgical History  Procedure Date  . Tonsillectomy 8 YOA  . Tendon repair 10 YOA    Left little finger  . Meniscus repair 03/1971    Medial, left knee open  . Polypectomy 06/14/2001    Colon, internal hemorrhoids  . Upper gastrointestinal endoscopy 01/12/06  . Ett myoview 02/16/2006    Mod inferior defect, neg ischemia  . Doppler echocardiography 02/11/06    Nml  EF 60%    History   Social History  . Marital Status: Married   Spouse Name: N/A    Number of Children: 3  . Years of Education: N/A   Occupational History  . 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 Smoking status: Former Smoker    Quit date: 10/07/1973  . Smokeless tobacco: Never Used  . Alcohol Use: Yes     wine daily, social drinker  . Drug Use: No  . Sexually Active: Not on file   Other Topics Concern  . Not on file   Social History Narrative  . No narrative on file     Objective:  BP 110/74  Pulse 86  Temp(Src) 98.1 F (36.7 C) (Oral)  Resp 16  Wt 170 lb 8 oz (77.338 kg)  SpO2 96%  General:  Well-developed, well-nourished. No acute distress General appearance: alert, cooperative and appears stated age Back: symmetric, no curvature. ROM normal. No CVA tenderness. Lungs: clear to auscultation bilaterally Chest wall: no tenderness Heart: regular rate and rhythm, S1, S2 normal, no murmur, click, rub or gallop Abdomen: soft, non-tender; bowel sounds normal; no masses,  no organomegaly Extremities: extremities normal, atraumatic, no cyanosis or edema, Homans sign is negative, no sign of DVT and no edema, redness or tenderness in the calves or thighs Pulses: 2+ and symmetric   Assessment:  1. Knee pain, right  Lower Extremity Venous Duplex Right  He has mild fullness in the popliteal fossa and I wonder whether he has a Baker's cyst which may be interfering with his range of motion on that side.  Plan:  Orders Placed This Encounter  Procedures  . Lower Extremity Venous Duplex Right    No Follow-up on file.   Medications:   Current Outpatient Prescriptions  Medication Sig Dispense Refill  . aspirin 81 MG tablet Take 81 mg by mouth daily.

## 2011-05-05 ENCOUNTER — Telehealth: Payer: Self-pay | Admitting: Internal Medicine

## 2011-05-05 NOTE — Telephone Encounter (Signed)
Patient came in the office very upset that he has not received his ultrasound results of his knee.  He stated this is unacceptable because he doesn't know what is wrong with him.  He wanted to know "who dropped the ball" with his results because the imaging center in [place] said they faxed the results the same day as the ultrasound and that was on 3/21.  He stated "if it is the imaging centers fault I will not go there anymore, and if its this offices fault he might not come here anymore."  I tried to explain to him that I have not seen the results and they are not in his chart, but I would check with Dr. Darrick Huntsman to see if she has seen them, and if she has not I will request the results from the Imaging center.   He was very upset when he left.  I told him I would call him back.

## 2011-05-05 NOTE — Telephone Encounter (Signed)
Patient notified.  ultrasound of popliteal  Space right side was normal.  Tirad sent initial report to [facility] which is why we did not receive it,  Patient very aapologetic about his demeanor to you .

## 2011-05-05 NOTE — Telephone Encounter (Signed)
Call [facility].  This result was faxed to [facility].  They changed your phone/fax to the correct #.  Laid results in your chair

## 2011-05-05 NOTE — Telephone Encounter (Signed)
I spoke with patient.  Please call [facility] and get the ultrasoundthat was done 3 weeks ago on Don Warner that I ordered.  I HAVE NOT received it.

## 2011-05-07 ENCOUNTER — Ambulatory Visit (INDEPENDENT_AMBULATORY_CARE_PROVIDER_SITE_OTHER): Payer: PRIVATE HEALTH INSURANCE | Admitting: Internal Medicine

## 2011-05-07 ENCOUNTER — Encounter: Payer: Self-pay | Admitting: Internal Medicine

## 2011-05-07 VITALS — BP 100/60 | HR 86 | Temp 98.3°F | Resp 18 | Wt 168.0 lb

## 2011-05-07 DIAGNOSIS — J40 Bronchitis, not specified as acute or chronic: Secondary | ICD-10-CM

## 2011-05-07 DIAGNOSIS — J069 Acute upper respiratory infection, unspecified: Secondary | ICD-10-CM

## 2011-05-07 MED ORDER — AZITHROMYCIN 500 MG PO TABS: 500.0000 mg | ORAL_TABLET | Freq: Every day | ORAL | Status: DC

## 2011-05-07 MED ORDER — PREDNISONE (PAK) 10 MG PO TABS: ORAL_TABLET | ORAL | Status: AC

## 2011-05-07 NOTE — Progress Notes (Signed)
Patient ID: Don Warner, male   DOB: 10/05/1950, 61 y.o.   MRN: [id number] Patient Active Problem List  Diagnoses  . COLONIC POLYPS  . PAROXYSMAL ATRIAL FIBRILLATION  . BENIGN PROSTATIC HYPERTROPHY  . CONTACT DERMATITIS DUE TO METALS  . OTHER SYMPTOMS REFERABLE TO ANKLE AND FOOT JOINT  . BACK PAIN  . FATIGUE  . Abdominal  pain, other specified site  . URI, acute    Subjective:  CC:   Chief Complaint  Patient presents with  . Sore Throat  . Cough    HPI:   Don Warner a 61 y.o. male who presents  With a two day history of sore throat,  productive cough , and sinus congestion without fevers or facial pain.  He has been taking generic mucinex and an OTC cough syrup.  NO imporvement,  Feels drained, no muscle aches.     Past Medical History  Diagnosis Date  . BPH (benign prostatic hypertrophy)   . Bicuspid aortic valve   . Arrhythmia     chronic atrial fibrillation    Past Surgical History  Procedure Date  . Tonsillectomy 8 YOA  . Tendon repair 10 YOA    Left little finger  . Meniscus repair 03/1971    Medial, left knee open  . Polypectomy 06/14/2001    Colon, internal hemorrhoids  . Upper gastrointestinal endoscopy 01/12/06  . Ett myoview 02/16/2006    Mod inferior defect, neg ischemia  . Doppler echocardiography 02/11/06    Nml  EF 60%         The following portions of the patient's history were reviewed and updated as appropriate: Allergies, current medications, and problem list.    Review of Systems:   12 Pt  review of systems was negative except those addressed in the HPI,     History   Social History  . Marital Status: Married    Spouse Name: N/A    Number of Children: 3  . Years of Education: N/A   Occupational History  . 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 Smoking status: Former Smoker    Quit date: 10/07/1973  . Smokeless tobacco: Never Used  . Alcohol Use: Yes     wine daily, social drinker  .  Drug Use: No  . Sexually Active: Not on file   Other Topics Concern  . Not on file   Social History Narrative  . No narrative on file    Objective:  BP 100/60  Pulse 86  Temp(Src) 98.3 F (36.8 C) (Oral)  Resp 18  Wt 168 lb (76.204 kg)  SpO2 97%  General appearance: alert, cooperative and appears stated age Ears: normal TM's and external ear canals both ears Throat: lips, mucosa, and tongue normal; teeth and gums normal Neck: no adenopathy, no carotid bruit, supple, symmetrical, trachea midline and thyroid not enlarged, symmetric, no tenderness/mass/nodules Back: symmetric, no curvature. ROM normal. No CVA tenderness. Lungs: clear to auscultation bilaterally Heart: regular rate and rhythm, S1, S2 normal, no murmur, click, rub or gallop Abdomen: soft, non-tender; bowel sounds normal; no masses,  no organomegaly Pulses: 2+ and symmetric Skin: Skin color, texture, turgor normal. No rashes or lesions Lymph nodes: Cervical, supraclavicular, and axillary nodes normal.  Assessment and Plan:  URI, acute Currently viral .  Advised to take generic OTC benadryl 25 mg evwry 8 hours for the drainage,  Sudafed PE  10 to 20 mg every 6 hours for the congestion,  and flushes sinuses twice daily with Simply Saline.   Add abx and prednisone if no improvement in 3 to 5 days or if signs and sx of bacterial sinusitis or otitis develop.     Updated Medication List Outpatient Encounter Prescriptions as of 05/07/2011  Medication Sig Dispense Refill  . aspirin 81 MG tablet Take 81 mg by mouth daily.        . metoprolol tartrate (LOPRESSOR) 25 MG tablet Take 12.5 mg by mouth 2 (two) times daily.      Marland Kitchen azithromycin (ZITHROMAX) 500 MG tablet Take 1 tablet (500 mg total) by mouth daily.  7 tablet  0  . predniSONE (STERAPRED UNI-PAK) 10 MG tablet 6 tablets on Day 1 , then reduce by 1 tablet daily until gone  21 tablet  0     No orders of the defined types were placed in this encounter.    No  Follow-up on file.

## 2011-05-07 NOTE — Patient Instructions (Addendum)
You have a viral  Syndrome .  The post nasal drip is causing your sore throat.  Lavage your sinuses twice daily with Simply saline nasal spray.  Use benadryl 25 mg every 8 hours and PE 10 to 30 every 8 hours to manage the drainage and congestion.  Gargle with salt water often for the sore throat.  Continue Robitussin cough syrup (has codeine) for the cough.  If the throat is no better  In 3 to 4 days OR  if you develop T > 100.4,  Green nasal discharge,  Or facial pain,  Start the antibiotic.  If your chest feels tighter or if you start wheezing, start the prednisone and the antibiotic.

## 2011-05-09 ENCOUNTER — Encounter: Payer: Self-pay | Admitting: Internal Medicine

## 2011-05-09 DIAGNOSIS — J069 Acute upper respiratory infection, unspecified: Secondary | ICD-10-CM | POA: Insufficient documentation

## 2011-05-09 NOTE — Assessment & Plan Note (Addendum)
Currently viral .  Advised to take generic OTC benadryl 25 mg evwry 8 hours for the drainage,  Sudafed PE  10 to 20 mg every 6 hours for the congestion,  and flushes sinuses twice daily with Simply Saline.   Add abx and prednisone if no improvement in 3 to 5 days or if signs and sx of bacterial sinusitis or otitis develop.

## 2011-05-14 ENCOUNTER — Encounter: Payer: Self-pay | Admitting: Internal Medicine

## 2011-07-27 ENCOUNTER — Ambulatory Visit (INDEPENDENT_AMBULATORY_CARE_PROVIDER_SITE_OTHER): Payer: PRIVATE HEALTH INSURANCE | Admitting: Internal Medicine

## 2011-07-27 ENCOUNTER — Encounter: Payer: Self-pay | Admitting: Internal Medicine

## 2011-07-27 VITALS — BP 110/76 | HR 82 | Temp 98.5°F | Resp 16 | Wt 171.5 lb

## 2011-07-27 DIAGNOSIS — M533 Sacrococcygeal disorders, not elsewhere classified: Secondary | ICD-10-CM

## 2011-07-27 NOTE — Progress Notes (Signed)
Patient ID: Don Warner, male   DOB: Jun 19, 1950, 61 y.o.   MRN: [id number] Patient Active Problem List  Diagnosis  . COLONIC POLYPS  . PAROXYSMAL ATRIAL FIBRILLATION  . BENIGN PROSTATIC HYPERTROPHY  . CONTACT DERMATITIS DUE TO METALS  . OTHER SYMPTOMS REFERABLE TO ANKLE AND FOOT JOINT  . BACK PAIN  . FATIGUE  . Abdominal  pain, other specified site  . URI, acute  . Coccygeal pain    Subjective:  CC:   Chief Complaint  Patient presents with  . Tailbone Pain    HPI:   Don Warner a 61 y.o. male who presents with pain on his tailbone region for several weeks. aggravated by sitting .  Has no recent fall or prior history of pilonodal cyst.  He has been sitting on inflatable doughnut to offload the area.  Not aggravated by walking or working out.    Past Medical History  Diagnosis Date  . BPH (benign prostatic hypertrophy)   . Bicuspid aortic valve   . Arrhythmia     chronic atrial fibrillation  . Atrial fibrillation     managed localy by [facility]    Past Surgical History  Procedure Date  . Tonsillectomy 8 YOA  . Tendon repair 10 YOA    Left little finger  . Meniscus repair 03/1971    Medial, left knee open  . Polypectomy 06/14/2001    Colon, internal hemorrhoids  . Upper gastrointestinal endoscopy 01/12/06  . Ett myoview 02/16/2006    Mod inferior defect, neg ischemia  . Doppler echocardiography 02/11/06    Nml  EF 60%         The following portions of the patient's history were reviewed and updated as appropriate: Allergies, current medications, and problem list.    Review of Systems:   12 Pt  review of systems was negative except those addressed in the HPI,     History   Social History  . Marital Status: Married    Spouse Name: N/A    Number of Children: 3  . Years of Education: N/A   Occupational History  . 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 Smoking status: Former Smoker    Quit date: 10/07/1973  .  Smokeless tobacco: Never Used  . Alcohol Use: Yes     wine daily, social drinker  . Drug Use: No  . Sexually Active: Not on file   Other Topics Concern  . Not on file   Social History Narrative  . No narrative on file    Objective:  BP 110/76  Pulse 82  Temp 98.5 F (36.9 C) (Oral)  Resp 16  Wt 171 lb 8 oz (77.792 kg)  SpO2 95%  General appearance: alert, cooperative and appears stated age Ears: normal TM's and external ear canals both ears Throat: lips, mucosa, and tongue normal; teeth and gums normal Neck: no adenopathy, no carotid bruit, supple, symmetrical, trachea midline and thyroid not enlarged, symmetric, no tenderness/mass/nodules Back: symmetric, no curvature. ROM normal. No CVA tenderness. Lungs: clear to auscultation bilaterally Heart: regular rate and rhythm, S1, S2 normal, no murmur, click, rub or gallop Abdomen: soft, non-tender; bowel sounds normal; no masses,  no organomegaly Pulses: 2+ and symmetric Skin: Skin color, texture, turgor normal. No rashes or lesions Lymph nodes: Cervical, supraclavicular, and axillary nodes normal.  Assessment and Plan:  Coccygeal pain On exam he is tender without redness or fluctuance, consistent with arthritis .  Recommended trial  of nsaids for a few weeks.  If no change or improvement, will consider imaging .     Updated Medication List Outpatient Encounter Prescriptions as of 07/27/2011  Medication Sig Dispense Refill  . aspirin 81 MG tablet Take 81 mg by mouth daily.        . cholecalciferol (VITAMIN D) 1000 UNITS tablet Take 1,000 Units by mouth daily.      Marland Kitchen DISCONTD: azithromycin (ZITHROMAX) 500 MG tablet Take 1 tablet (500 mg total) by mouth daily.  7 tablet  0  . DISCONTD: metoprolol tartrate (LOPRESSOR) 25 MG tablet Take 12.5 mg by mouth 2 (two) times daily.         No orders of the defined types were placed in this encounter.    No Follow-up on file.

## 2011-07-28 ENCOUNTER — Encounter: Payer: Self-pay | Admitting: Internal Medicine

## 2011-07-28 DIAGNOSIS — M533 Sacrococcygeal disorders, not elsewhere classified: Secondary | ICD-10-CM | POA: Insufficient documentation

## 2011-07-28 NOTE — Assessment & Plan Note (Signed)
On exam he is tender without redness or fluctuance, consistent with arthritis .  Recommended trial of nsaids for a few weeks.  If no change or improvement, will consider imaging .

## 2011-09-29 ENCOUNTER — Encounter: Payer: Self-pay | Admitting: Internal Medicine

## 2011-09-29 ENCOUNTER — Ambulatory Visit (INDEPENDENT_AMBULATORY_CARE_PROVIDER_SITE_OTHER): Payer: PRIVATE HEALTH INSURANCE | Admitting: Internal Medicine

## 2011-09-29 VITALS — BP 116/70 | HR 74 | Resp 18 | Wt 172.2 lb

## 2011-09-29 DIAGNOSIS — K2990 Gastroduodenitis, unspecified, without bleeding: Secondary | ICD-10-CM

## 2011-09-29 DIAGNOSIS — K2970 Gastritis, unspecified, without bleeding: Secondary | ICD-10-CM

## 2011-09-29 DIAGNOSIS — M533 Sacrococcygeal disorders, not elsewhere classified: Secondary | ICD-10-CM

## 2011-09-29 MED ORDER — TRAMADOL HCL 50 MG PO TABS: 50.0000 mg | ORAL_TABLET | Freq: Four times a day (QID) | ORAL | Status: AC | PRN

## 2011-09-29 MED ORDER — DEXLANSOPRAZOLE 60 MG PO CPDR: 60.0000 mg | DELAYED_RELEASE_CAPSULE | Freq: Every day | ORAL | Status: DC

## 2011-09-29 NOTE — Patient Instructions (Addendum)
Reduce aspirin to every other day .,  Stop all NSAIDs  Trial of Dexilant (one daily in the morning)  for stomach and tramadol (every 6 hours as needed )for pain   If Dexilant resolves the stomach issues,  We will swlitch to generic omeprazole 40 mg daily  Or other PPI covered by your insurance.

## 2011-09-29 NOTE — Progress Notes (Signed)
Patient ID: Don Warner, male   DOB: 1950-12-14, 61 y.o.   MRN: [id number]  Patient Active Problem List  Diagnosis  . COLONIC POLYPS  . PAROXYSMAL ATRIAL FIBRILLATION  . BENIGN PROSTATIC HYPERTROPHY  . CONTACT DERMATITIS DUE TO METALS  . OTHER SYMPTOMS REFERABLE TO ANKLE AND FOOT JOINT  . BACK PAIN  . FATIGUE  . Abdominal  pain, other specified site  . URI, acute  . Coccygeal pain  . Gastritis    Subjective:  CC:   Chief Complaint  Patient presents with  . Tailbone Pain    HPI:   Don Warner a 61 y.o. male who presents Follow up on chronic tailbone pain with new complaint of gastritis,  Having 3 to 4 "shredded" stools daily,  Wakes up with one .  Has been cyclical in the past years,  Now daily and sustained.  Wakes up  Needing to have one,  Has one occasionally in the middle of the night.  Tailbone pain is not improved but not worse. Started taking benefiber this morning.  Not on a PPI.  Has noticed some heat intolerance.  TSH was normal in November.    Past Medical History  Diagnosis Date  . BPH (benign prostatic hypertrophy)   . Bicuspid aortic valve   . Arrhythmia     chronic atrial fibrillation  . Atrial fibrillation     managed localy by [facility]    Past Surgical History  Procedure Date  . Tonsillectomy 8 YOA  . Tendon repair 10 YOA    Left little finger  . Meniscus repair 03/1971    Medial, left knee open  . Polypectomy 06/14/2001    Colon, internal hemorrhoids  . Upper gastrointestinal endoscopy 01/12/06  . Ett myoview 02/16/2006    Mod inferior defect, neg ischemia  . Doppler echocardiography 02/11/06    Nml  EF 60%     The following portions of the patient's history were reviewed and updated as appropriate: Allergies, current medications, and problem list.    Review of Systems:   12 Pt  review of systems was negative except those addressed in the HPI,     History   Social History  . Marital Status: Married    Spouse Name: N/A   Number of Children: 3  . Years of Education: N/A   Occupational History  . 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 Smoking status: Former Smoker    Quit date: 10/07/1973  . Smokeless tobacco: Never Used  . Alcohol Use: Yes     wine daily, social drinker  . Drug Use: No  . Sexually Active: Not on file   Other Topics Concern  . Not on file   Social History Narrative  . No narrative on file    Objective:  BP 116/70  Pulse 74  Resp 18  Wt 172 lb 4 oz (78.132 kg)  SpO2 96%  General appearance: alert, cooperative and appears stated age Neck: no adenopathy, no carotid bruit, supple, symmetrical, trachea midline and thyroid not enlarged, symmetric, no tenderness/mass/nodules Back: symmetric, no curvature. ROM normal. No CVA tenderness. Lungs: clear to auscultation bilaterally Heart: regular rate and rhythm, S1, S2 normal, no murmur, click, rub or gallop Abdomen: soft, non-tender; bowel sounds normal; no masses,  no organomegaly Pulses: 2+ and symmetric Skin: Skin color, texture, turgor normal. No rashes or lesions Lymph nodes: Cervical, supraclavicular, and axillary nodes normal.  Assessment and Plan:  Gastritis Trial of  Dexilant,  Stop all NSAIDs except baby aspirin. If the diarrhea does not improve will refer for colonoscopy.   Coccygeal pain No improvement or progression since starting NSAIDs.  Trial of tramadol.    Updated Medication List Outpatient Encounter Prescriptions as of 09/29/2011  Medication Sig Dispense Refill  . aspirin 81 MG tablet Take 81 mg by mouth daily.        . cholecalciferol (VITAMIN D) 1000 UNITS tablet Take 1,000 Units by mouth daily.      Marland Kitchen dexlansoprazole (DEXILANT) 60 MG capsule Take 1 capsule (60 mg total) by mouth daily.  30 capsule  0  . traMADol (ULTRAM) 50 MG tablet Take 1 tablet (50 mg total) by mouth every 6 (six) hours as needed for pain.  90 tablet  0     No orders of the defined types were placed in  this encounter.    No Follow-up on file.

## 2011-09-30 ENCOUNTER — Encounter: Payer: Self-pay | Admitting: Internal Medicine

## 2011-09-30 DIAGNOSIS — K2970 Gastritis, unspecified, without bleeding: Secondary | ICD-10-CM | POA: Insufficient documentation

## 2011-09-30 NOTE — Assessment & Plan Note (Signed)
No improvement or progression since starting NSAIDs.  Trial of tramadol.

## 2011-09-30 NOTE — Assessment & Plan Note (Signed)
Trial of Dexilant,  Stop all NSAIDs except baby aspirin. If the diarrhea does not improve will refer for colonoscopy.

## 2011-10-06 ENCOUNTER — Encounter: Payer: Self-pay | Admitting: Internal Medicine

## 2011-10-06 DIAGNOSIS — R1013 Epigastric pain: Secondary | ICD-10-CM

## 2011-10-08 MED ORDER — PANTOPRAZOLE SODIUM 40 MG PO TBEC: 40.0000 mg | DELAYED_RELEASE_TABLET | Freq: Every day | ORAL | Status: DC

## 2011-10-08 MED ORDER — FAMOTIDINE 40 MG PO TABS: 40.0000 mg | ORAL_TABLET | Freq: Every day | ORAL | Status: DC

## 2011-10-14 ENCOUNTER — Encounter: Payer: Self-pay | Admitting: Internal Medicine

## 2011-10-14 ENCOUNTER — Telehealth: Payer: Self-pay | Admitting: Internal Medicine

## 2011-10-14 DIAGNOSIS — M533 Sacrococcygeal disorders, not elsewhere classified: Secondary | ICD-10-CM

## 2011-10-14 DIAGNOSIS — K2970 Gastritis, unspecified, without bleeding: Secondary | ICD-10-CM

## 2011-10-22 ENCOUNTER — Encounter: Payer: Self-pay | Admitting: Internal Medicine

## 2011-11-10 ENCOUNTER — Encounter: Payer: Self-pay | Admitting: Internal Medicine

## 2012-01-05 ENCOUNTER — Ambulatory Visit (INDEPENDENT_AMBULATORY_CARE_PROVIDER_SITE_OTHER): Payer: PRIVATE HEALTH INSURANCE | Admitting: Internal Medicine

## 2012-01-05 ENCOUNTER — Encounter: Payer: Self-pay | Admitting: Internal Medicine

## 2012-01-05 VITALS — BP 100/64 | HR 83 | Temp 97.6°F | Resp 12 | Ht 70.0 in | Wt 174.0 lb

## 2012-01-05 DIAGNOSIS — M533 Sacrococcygeal disorders, not elsewhere classified: Secondary | ICD-10-CM

## 2012-01-05 DIAGNOSIS — I4891 Unspecified atrial fibrillation: Secondary | ICD-10-CM

## 2012-01-05 DIAGNOSIS — K2970 Gastritis, unspecified, without bleeding: Secondary | ICD-10-CM

## 2012-01-05 DIAGNOSIS — G4700 Insomnia, unspecified: Secondary | ICD-10-CM

## 2012-01-05 DIAGNOSIS — I482 Chronic atrial fibrillation, unspecified: Secondary | ICD-10-CM

## 2012-01-05 DIAGNOSIS — K2990 Gastroduodenitis, unspecified, without bleeding: Secondary | ICD-10-CM

## 2012-01-05 DIAGNOSIS — R1013 Epigastric pain: Secondary | ICD-10-CM

## 2012-01-05 DIAGNOSIS — Z1331 Encounter for screening for depression: Secondary | ICD-10-CM

## 2012-01-05 DIAGNOSIS — K3189 Other diseases of stomach and duodenum: Secondary | ICD-10-CM

## 2012-01-05 MED ORDER — PANTOPRAZOLE SODIUM 40 MG PO TBEC: 40.0000 mg | DELAYED_RELEASE_TABLET | Freq: Every day | ORAL | Status: DC

## 2012-01-05 NOTE — Progress Notes (Signed)
Patient ID: Don Warner, male   DOB: 07-27-1950, 61 y.o.   MRN: [id number]  Patient Active Problem List  Diagnosis  . COLONIC POLYPS  . Chronic atrial fibrillation  . BENIGN PROSTATIC HYPERTROPHY  . BACK PAIN  . FATIGUE  . Abdominal  pain, other specified site  . Coccygeal pain  . Gastritis  . Insomnia    Subjective:  CC:   Chief Complaint  Patient presents with  . Follow-up    HPI:   Don Warner a 61 y.o. male who presents Persistent Coccygeal pain, occurs daily.  Aggravated by position change,  Not severe, but present long enough (1 year) that he is concerned about etiology being some kind of mass.  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  His symptoms are resolved with daily use of a PPI taken correctly . No weight loss, esophageal pain or persistent cough.    Past Medical History  Diagnosis Date  . BPH (benign prostatic hypertrophy)   . Bicuspid aortic valve   . Arrhythmia     chronic atrial fibrillation  . Atrial fibrillation     managed localy by [facility]    Past Surgical History  Procedure Date  . Tonsillectomy 8 YOA  . Tendon repair 10 YOA    Left little finger  . Meniscus repair 03/1971    Medial, left knee open  . Polypectomy 06/14/2001    Colon, internal hemorrhoids  . Upper gastrointestinal endoscopy 01/12/06  . Ett myoview 02/16/2006    Mod inferior defect, neg ischemia  . Doppler echocardiography 02/11/06    Nml  EF 60%    The following portions of the patient's history were reviewed and updated as appropriate: Allergies, current medications, and problem list.    Review of Systems:  Patient denies headache, fevers, malaise, unintentional weight loss, skin rash, eye pain, sinus congestion and sinus pain, sore throat, dysphagia,  hemoptysis , cough, dyspnea, wheezing, chest pain, palpitations, orthopnea, edema, abdominal pain,  nausea, melena, diarrhea, constipation, flank pain, dysuria, hematuria, urinary  Frequency, nocturia, numbness, tingling, seizures,  Focal weakness, Loss of consciousness,  Tremor, insomnia, depression, anxiety, and suicidal ideation.         History   Social History  . Marital Status: Married    Spouse Name: N/A    Number of Children: 3  . Years of Education: N/A   Occupational History  . 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 Smoking status: Former Smoker    Quit date: 10/07/1973  . Smokeless tobacco: Never Used  . Alcohol Use: Yes     Comment: wine daily, social drinker  . Drug Use: No  . Sexually Active: Not on file   Other Topics Concern  . Not on file   Social History Narrative  . No narrative on file    Objective:  BP 100/64  Pulse 83  Temp 97.6 F (36.4 C) (Oral)  Resp 12  Ht 5\' 10"  (1.778 m)  Wt 174 lb (78.926 kg)  BMI 24.97 kg/m2  SpO2 97%  General appearance: alert, cooperative and appears stated age Ears: normal TM's and external ear canals both ears Throat: lips, mucosa, and tongue normal; teeth and gums normal Neck: no adenopathy, no carotid bruit, supple, symmetrical, trachea midline and thyroid not enlarged, symmetric, no tenderness/mass/nodules Back: symmetric, no curvature. ROM normal. No CVA tenderness. Lungs:  clear to auscultation bilaterally Heart: regular rate and rhythm, S1, S2 normal, no murmur, click, rub or gallop Abdomen: soft, non-tender; bowel sounds normal; no masses,  no organomegaly Pulses: 2+ and symmetric Skin: Skin color, texture, turgor normal. No rashes or lesions Lymph nodes: Cervical, supraclavicular, and axillary nodes normal.  Assessment and Plan:  Chronic atrial fibrillation Managed with prn metoprolol taken on the evening,  Other choices discussed as alternatives but he is happy with current medication with no side effects.   Insomnia Managed with valerian root, tylenol and  metoprolol  Coccygeal pain Exam has been normal.  Plain x rays of sacrum and coccyx have been ordered,  Patient has deferred until now.  Gastritis Acute symptoms have resolved with use of Dexilant  Continue daily use of protonix and pepcid.    Updated Medication List Outpatient Encounter Prescriptions as of 01/05/2012  Medication Sig Dispense Refill  . aspirin 81 MG tablet Take 81 mg by mouth daily.        . cholecalciferol (VITAMIN D) 1000 UNITS tablet Take 1,000 Units by mouth daily.      . metoprolol tartrate (LOPRESSOR) 12.5 mg TABS Take 12.5 mg by mouth 2 (two) times daily.      Marland Kitchen dexlansoprazole (DEXILANT) 60 MG capsule Take 1 capsule (60 mg total) by mouth daily.  30 capsule  0  . famotidine (PEPCID) 40 MG tablet Take 1 tablet (40 mg total) by mouth daily.  30 tablet  5  . pantoprazole (PROTONIX) 40 MG tablet Take 1 tablet (40 mg total) by mouth daily.  30 tablet  11  . [DISCONTINUED] pantoprazole (PROTONIX) 40 MG tablet Take 1 tablet (40 mg total) by mouth daily.  30 tablet  3     Orders Placed This Encounter  Procedures  . DG Sacrum/Coccyx    No Follow-up on file.

## 2012-01-05 NOTE — Assessment & Plan Note (Signed)
Managed with valerian root, tylenol and metoprolol

## 2012-01-05 NOTE — Assessment & Plan Note (Addendum)
Managed with prn metoprolol taken on the evening,  Other choices discussed as alternatives but he is happy with current medication with no side effects.

## 2012-01-07 ENCOUNTER — Encounter: Payer: Self-pay | Admitting: Internal Medicine

## 2012-01-07 NOTE — Assessment & Plan Note (Addendum)
Acute symptoms have resolved with use of Dexilant  Continue daily use of protonix and pepcid.

## 2012-01-07 NOTE — Assessment & Plan Note (Signed)
Exam has been normal.  Plain x rays of sacrum and coccyx have been ordered,  Patient has deferred until now.

## 2012-01-12 ENCOUNTER — Encounter: Payer: Self-pay | Admitting: Internal Medicine

## 2012-01-12 ENCOUNTER — Telehealth: Payer: Self-pay | Admitting: Internal Medicine

## 2012-01-12 NOTE — Telephone Encounter (Signed)
Patient notified of normal sacral  X rays via Mychart

## 2012-02-09 ENCOUNTER — Encounter: Payer: Self-pay | Admitting: Internal Medicine

## 2012-03-18 ENCOUNTER — Other Ambulatory Visit: Payer: Self-pay

## 2012-08-17 ENCOUNTER — Encounter: Payer: Self-pay | Admitting: *Deleted

## 2012-10-05 ENCOUNTER — Encounter: Payer: Self-pay | Admitting: Internal Medicine

## 2012-10-13 ENCOUNTER — Encounter: Payer: Self-pay | Admitting: Internal Medicine

## 2012-10-13 ENCOUNTER — Ambulatory Visit (INDEPENDENT_AMBULATORY_CARE_PROVIDER_SITE_OTHER): Payer: PRIVATE HEALTH INSURANCE | Admitting: Internal Medicine

## 2012-10-13 VITALS — BP 108/78 | HR 65 | Temp 97.3°F | Resp 12 | Ht 70.0 in | Wt 175.5 lb

## 2012-10-13 DIAGNOSIS — Q231 Congenital insufficiency of aortic valve: Secondary | ICD-10-CM

## 2012-10-13 DIAGNOSIS — K2970 Gastritis, unspecified, without bleeding: Secondary | ICD-10-CM

## 2012-10-13 DIAGNOSIS — G4700 Insomnia, unspecified: Secondary | ICD-10-CM

## 2012-10-13 DIAGNOSIS — D126 Benign neoplasm of colon, unspecified: Secondary | ICD-10-CM

## 2012-10-13 NOTE — Progress Notes (Signed)
Patient ID: Don Warner, male   DOB: October 13, 1950, 62 y.o.   MRN: [id number]   Patient Active Problem List   Diagnosis Date Noted  . Bicuspid aortic valve 10/13/2012  . Insomnia 01/05/2012  . Gastritis 09/30/2011  . Coccygeal pain 07/28/2011  . Abdominal  pain, other specified site 06/10/2010  . BACK PAIN 09/08/2009  . COLONIC POLYPS 10/23/2008  . Chronic atrial fibrillation 10/23/2008  . BENIGN PROSTATIC HYPERTROPHY 10/23/2008    Subjective:  CC:   Chief Complaint  Patient presents with  . Back Pain    discuss repeat labs, Back pain X 2weeks right of spine 5-6 rib.    dPI:   Don Warner a 62 y.o. male who presents for 6 month follow up on multiple conditions.,.  He has been having increased frequency of formed stools, up to 5 daily  which has resolved since his wife Don Warner, a retired  Charity fundraiser, recommended he follow a low residue diet.  He is now having one stool daily  .  His Last colonoscopy 2007.  He has occasional  BRBPR and occasional rectal pain which he attributes to internal hemorrhoids  He has been having mid thoracic right sided back pain which is intermittent and aggravated by exercise, specifically, when he performs the seated shoulder press.  palpaple tenderness noted over the bottom rib near the costovertebral junction.   He is due for annual follow up on bicuspid aortic valve but his prior cardiologist has joined [facility], which is now par tof the [facility] and he is not sure whether it will cost him more to continue seeing Don Warner.  He is concerned that he may need a referral to another cardiologist.     Past Medical History  Diagnosis Date  . BPH (benign prostatic hypertrophy)   . Bicuspid aortic valve   . Arrhythmia     chronic atrial fibrillation  . Atrial fibrillation     managed localy by [facility]  . Heart murmur     Past Surgical History  Procedure Laterality Date  . Tonsillectomy  8 YOA  . Tendon repair  10 YOA    Left little finger  .  Meniscus repair  03/1971    Medial, left knee open  . Polypectomy  06/14/2001    Colon, internal hemorrhoids  . Upper gastrointestinal endoscopy  01/12/06  . Ett myoview  02/16/2006    Mod inferior defect, neg ischemia  . Doppler echocardiography  02/11/06    Nml  EF 60%  . Colonoscopy  2008       The following portions of the patient's history were reviewed and updated as appropriate: Allergies, current medications, and problem list.    Review of Systems:   12 Pt  review of systems was negative except those addressed in the HPI,     History   Social History  . Marital Status: Married    Spouse Name: N/A    Number of Children: 3  . 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 Smoking status: Former Smoker    Quit date: 10/07/1973  . Smokeless tobacco: Never Used  . Alcohol Use: Yes     Comment: wine daily, social drinker  . Drug Use: No  . Sexual Activity: Not on file   Other Topics Concern  . Not on file   Social History Narrative  . No narrative on file  Objective:  Filed Vitals:   10/13/12 1110  BP: 108/78  Pulse: 65  Temp: 97.3 F (36.3 C)  Resp: 12     General appearance: alert, cooperative and appears stated age Ears: normal TM's and external ear canals both ears Throat: lips, mucosa, and tongue normal; teeth and gums normal Neck: no adenopathy, no carotid bruit, supple, symmetrical, trachea midline and thyroid not enlarged, symmetric, no tenderness/mass/nodules Back: symmetric, no curvature. ROM normal. No CVA tenderness. Lungs: clear to auscultation bilaterally Heart: regular rate and rhythm, S1, S2 normal, no murmur, click, rub or gallop Abdomen: soft, non-tender; bowel sounds normal; no masses,  no organomegaly Pulses: 2+ and symmetric Skin: Skin color, texture, turgor normal. No rashes or lesions Lymph nodes: Cervical, supraclavicular, and axillary nodes  normal.  Assessment and Plan:  Bicuspid aortic valve He is due for annual ECHO.  Advised him to contact Don Warner office before deciding whether to continue with him or switch to Don Warner's Don Warner.   COLONIC POLYPS H e was referred to Don Warner for follow up and repeat colonoscopy was planned due to change in bowel habits, but since he changed his diet to low residue his stool frequency has improved to one formed stool daily, so he has deferred colonoscopy for now.  I have recommended that he do the colonoscopy given his reports of BRBPR and rectal pain concerning for fissure.   Gastritis Resolved,  He is no longer taking a PPI   Insomnia Intermittent, managed with tylenol PM and melatonin   Updated Medication List Outpatient Encounter Prescriptions as of 10/13/2012  Medication Sig Dispense Refill  . aspirin 81 MG tablet Take 81 mg by mouth daily.        . cholecalciferol (VITAMIN D) 1000 UNITS tablet Take 1,000 Units by mouth daily.      . fish oil-omega-3 fatty acids 1000 MG capsule Take 2 g by mouth daily.      . metoprolol tartrate (LOPRESSOR) 12.5 mg TABS Take 12.5 mg by mouth 2 (two) times daily.      . Multiple Vitamins-Minerals (MULTIVITAMIN WITH MINERALS) tablet Take 1 tablet by mouth daily.      . Red Yeast Rice Extract 600 MG CAPS Take 1 capsule by mouth 2 (two) times daily.      . [DISCONTINUED] dexlansoprazole (DEXILANT) 60 MG capsule Take 1 capsule (60 mg total) by mouth daily.  30 capsule  0  . [DISCONTINUED] famotidine (PEPCID) 40 MG tablet Take 1 tablet (40 mg total) by mouth daily.  30 tablet  5  . [DISCONTINUED] pantoprazole (PROTONIX) 40 MG tablet Take 1 tablet (40 mg total) by mouth daily.  30 tablet  11   No facility-administered encounter medications on file as of 10/13/2012.     Orders Placed This Encounter  Procedures  . HM COLONOSCOPY    No Follow-up on file.

## 2012-10-14 ENCOUNTER — Encounter: Payer: Self-pay | Admitting: Internal Medicine

## 2012-10-14 NOTE — Assessment & Plan Note (Signed)
He is due for annual ECHO.  Advised him to contact Dr Glennis Brink office before deciding whether to continue with him or switch to [facility] Dr. Kirke Corin.

## 2012-10-14 NOTE — Assessment & Plan Note (Addendum)
H e was referred to [facility] for follow up and repeat colonoscopy was planned due to change in bowel habits, but since he changed his diet to low residue his stool frequency has improved to one formed stool daily, so he has deferred colonoscopy for now.  I have recommended that he do the colonoscopy given his reports of BRBPR and rectal pain concerning for fissure.

## 2012-10-15 NOTE — Assessment & Plan Note (Signed)
Resolved,  He is no longer taking a PPI

## 2012-10-15 NOTE — Assessment & Plan Note (Signed)
Intermittent, managed with tylenol PM and melatonin

## 2012-11-07 ENCOUNTER — Other Ambulatory Visit: Payer: Self-pay | Admitting: Internal Medicine

## 2012-11-07 DIAGNOSIS — Z125 Encounter for screening for malignant neoplasm of prostate: Secondary | ICD-10-CM | POA: Insufficient documentation

## 2012-12-07 ENCOUNTER — Other Ambulatory Visit: Payer: Self-pay

## 2012-12-15 ENCOUNTER — Encounter: Payer: Self-pay | Admitting: Internal Medicine

## 2012-12-15 ENCOUNTER — Ambulatory Visit (INDEPENDENT_AMBULATORY_CARE_PROVIDER_SITE_OTHER): Payer: PRIVATE HEALTH INSURANCE | Admitting: Internal Medicine

## 2012-12-15 VITALS — BP 118/74 | HR 98 | Temp 97.5°F | Resp 12 | Ht 70.0 in | Wt 179.0 lb

## 2012-12-15 DIAGNOSIS — R140 Abdominal distension (gaseous): Secondary | ICD-10-CM

## 2012-12-15 DIAGNOSIS — R141 Gas pain: Secondary | ICD-10-CM

## 2012-12-15 DIAGNOSIS — Q231 Congenital insufficiency of aortic valve: Secondary | ICD-10-CM

## 2012-12-15 DIAGNOSIS — I4891 Unspecified atrial fibrillation: Secondary | ICD-10-CM

## 2012-12-15 DIAGNOSIS — M25551 Pain in right hip: Secondary | ICD-10-CM

## 2012-12-15 DIAGNOSIS — M25559 Pain in unspecified hip: Secondary | ICD-10-CM

## 2012-12-15 DIAGNOSIS — E785 Hyperlipidemia, unspecified: Secondary | ICD-10-CM

## 2012-12-15 DIAGNOSIS — I482 Chronic atrial fibrillation, unspecified: Secondary | ICD-10-CM

## 2012-12-15 NOTE — Progress Notes (Signed)
Pre-visit discussion using our clinic review tool. No additional management support is needed unless otherwise documented below in the visit note.  

## 2012-12-15 NOTE — Progress Notes (Signed)
Patient ID: Don Warner, male   DOB: 10-25-1950, 62 y.o.   MRN: [id number]   Patient Active Problem List   Diagnosis Date Noted  . Postprandial abdominal bloating 12/17/2012  . Hip pain, bilateral 12/17/2012  . Special screening for malignant neoplasm of prostate 11/07/2012  . Bicuspid aortic valve 10/13/2012  . Insomnia 01/05/2012  . Coccygeal pain 07/28/2011  . BACK PAIN 09/08/2009  . COLONIC POLYPS 10/23/2008  . Chronic atrial fibrillation 10/23/2008  . BENIGN PROSTATIC HYPERTROPHY 10/23/2008    Subjective:  CC:   Chief Complaint  Patient presents with  . Acute Visit    C/O hip pain crossing legs will accerbate. C/O bloating after meals but not associated with any particular food    HPI:   Don Warner a 62 y.o. male who presents for follow up on chronic medical conditions including chronic lone atrial fibrillation,. Bicuspid aortic valve, gastritis vs IBS, and joint pain.  He had an annual ECHO Oct 14th by [facility], but has not received the results despite several attempts to contact [place] [facility].  GI: He did not have a colonoscopy but saw [facility] for change in bowel habits. Has been on a low fiber diet and has been having recurrent abdominal bloating post prandially.  He has tried using a commercial product called Garden of Life which reportedly has multiple digestive and nutritional enzymes which has not helped.  No unintentional wt loss,  Abdominal pain, chest pain or dark stools.  Stools are formed.   He has a new complaint of bilateral hip pain brought on by lying on one side or crossing his legs.  It is disrupting his sleep and he has had to modify his workuout because of the pain.  There is no history of fall.       Past Medical History  Diagnosis Date  . BPH (benign prostatic hypertrophy)   . Bicuspid aortic valve   . Arrhythmia     chronic atrial fibrillation  . Atrial fibrillation     managed localy by [facility]  . Heart murmur     Past Surgical History   Procedure Laterality Date  . Tonsillectomy  8 YOA  . Tendon repair  10 YOA    Left little finger  . Meniscus repair  03/1971    Medial, left knee open  . Polypectomy  06/14/2001    Colon, internal hemorrhoids  . Upper gastrointestinal endoscopy  01/12/06  . Ett myoview  02/16/2006    Mod inferior defect, neg ischemia  . Doppler echocardiography  02/11/06    Nml  EF 60%  . Colonoscopy  2008       The following portions of the patient's history were reviewed and updated as appropriate: Allergies, current medications, and problem list.    Review of Systems:   12 Pt  review of systems was negative except those addressed in the HPI,     History   Social History  . Marital Status: Married    Spouse Name: N/A    Number of Children: 3  . Years of Education: N/A   Occupational History  . 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 Smoking status: Former Smoker    Quit date: 10/07/1973  . Smokeless tobacco: Never Used  . Alcohol Use: Yes     Comment: wine daily, social drinker  . Drug Use: No  . Sexual Activity: Not on file   Other Topics Concern  . Not on file  Social History Narrative  . No narrative on file    Objective:  Filed Vitals:   12/15/12 1008  BP: 118/74  Pulse: 98  Temp: 97.5 F (36.4 C)  Resp: 12     General appearance: alert, cooperative and appears stated age Ears: normal TM's and external ear canals both ears Throat: lips, mucosa, and tongue normal; teeth and gums normal Neck: no adenopathy, no carotid bruit, supple, symmetrical, trachea midline and thyroid not enlarged, symmetric, no tenderness/mass/nodules Back: symmetric, no curvature. ROM normal. No CVA tenderness. Lungs: clear to auscultation bilaterally Heart: regular rate and rhythm, S1, S2 normal, no murmur, click, rub or gallop Abdomen: soft, non-tender; bowel sounds normal; no masses,  no organomegaly Pulses: 2+ and symmetric Skin: Skin color,  texture, turgor normal. No rashes or lesions Lymph nodes: Cervical, supraclavicular, and axillary nodes normal.  Assessment and Plan: Postprandial abdominal bloating He has no warning signs such as wt loss, change in stools. Etc.  His symptoms are likely caused by low residue diet. Recommended returning to normal reside diet and use of Beano and Lactase.  I suspect painet has IBS given his multiple GI complaints over the years which have been evaluated with no signs of pathology.   Bicuspid aortic valve With mitral valve prolapse and  chronic atrial fib controlled with metoprolol,  Not on anticoagulation but takes asa daily. Recent ECHO and cardiology follow up done, records requested.   Chronic atrial fibrillation Rate controlled with metoprolol.   Hip pain, bilateral He has some pain with internal rotation of both hips and is concerned that he has DJD.  Palin films ordered,  advil and tylenol prn.   Other and unspecified hyperlipidemia Managed with Red Yeast rice.  Fasting labs done at [facility] have not been received despite seveeal weeks passage.    Updated Medication List Outpatient Encounter Prescriptions as of 12/15/2012  Medication Sig  . aspirin 81 MG tablet Take 81 mg by mouth daily.    . cholecalciferol (VITAMIN D) 1000 UNITS tablet Take 1,000 Units by mouth daily.  . fish oil-omega-3 fatty acids 1000 MG capsule Take 2 g by mouth daily.  . metoprolol tartrate (LOPRESSOR) 12.5 mg TABS Take 12.5 mg by mouth 2 (two) times daily.  . Multiple Vitamins-Minerals (MULTIVITAMIN WITH MINERALS) tablet Take 1 tablet by mouth daily.  . Red Yeast Rice Extract 600 MG CAPS Take 1 capsule by mouth 2 (two) times daily.

## 2012-12-15 NOTE — Patient Instructions (Signed)
You can use up to 4 advil every  8 hours  For 2 weeks and 500 mg tylenol every 8 hours safely  Plain films of hips ad npelvis to be doen at [facility]  If your diet isn't helping change i  The starches are bloating you   Compare beano to your garden of life   lactase for mild products

## 2012-12-17 ENCOUNTER — Encounter: Payer: Self-pay | Admitting: Internal Medicine

## 2012-12-17 DIAGNOSIS — E785 Hyperlipidemia, unspecified: Secondary | ICD-10-CM | POA: Insufficient documentation

## 2012-12-17 DIAGNOSIS — R140 Abdominal distension (gaseous): Secondary | ICD-10-CM | POA: Insufficient documentation

## 2012-12-17 DIAGNOSIS — M25551 Pain in right hip: Secondary | ICD-10-CM | POA: Insufficient documentation

## 2012-12-17 NOTE — Assessment & Plan Note (Signed)
With mitral valve prolapse and  chronic atrial fib controlled with metoprolol,  Not on anticoagulation but takes asa daily. Recent ECHO and cardiology follow up done, records requested.

## 2012-12-17 NOTE — Assessment & Plan Note (Signed)
Managed with Red Yeast rice.  Fasting labs done at [facility] have not been received despite seveeal weeks passage.

## 2012-12-17 NOTE — Assessment & Plan Note (Addendum)
He has some pain with internal rotation of both hips and is concerned that he has DJD.  Palin films ordered,  advil and tylenol prn.

## 2012-12-17 NOTE — Assessment & Plan Note (Addendum)
He has no warning signs such as wt loss, change in stools. Etc.  His symptoms are likely caused by low residue diet. Recommended returning to normal reside diet and use of Beano and Lactase.  I suspect painet has IBS given his multiple GI complaints over the years which have been evaluated with no signs of pathology.

## 2012-12-17 NOTE — Assessment & Plan Note (Signed)
Rate controlled with metoprolol °

## 2013-01-09 ENCOUNTER — Encounter: Payer: Self-pay | Admitting: Internal Medicine

## 2013-01-09 NOTE — Telephone Encounter (Signed)
Please call [facility] and find out why they have not sent me this patient's labs from September

## 2013-02-22 ENCOUNTER — Encounter: Payer: Self-pay | Admitting: Adult Health

## 2013-02-22 ENCOUNTER — Ambulatory Visit (INDEPENDENT_AMBULATORY_CARE_PROVIDER_SITE_OTHER): Payer: PRIVATE HEALTH INSURANCE | Admitting: Adult Health

## 2013-02-22 VITALS — BP 100/58 | HR 80 | Temp 98.2°F | Resp 12 | Wt 174.0 lb

## 2013-02-22 DIAGNOSIS — J069 Acute upper respiratory infection, unspecified: Secondary | ICD-10-CM

## 2013-02-22 MED ORDER — AZITHROMYCIN 250 MG PO TABS: ORAL_TABLET | ORAL | Status: DC

## 2013-02-22 MED ORDER — FLUTICASONE PROPIONATE 50 MCG/ACT NA SUSP: 2.0000 | Freq: Every day | NASAL | Status: DC

## 2013-02-22 NOTE — Assessment & Plan Note (Signed)
I believe he is improving. Start flonase nasal spray. Provided him with prescription for Azithromycin but advised him not to take unless he develops a fever, facial pain, green secretions or his symptoms fail to improve within the next few days. He agreed.

## 2013-02-22 NOTE — Progress Notes (Signed)
Pre visit review using our clinic review tool, if applicable. No additional management support is needed unless otherwise documented below in the visit note. 

## 2013-02-22 NOTE — Patient Instructions (Signed)
  Continue the over the counter cough medication as needed.  Start flonase 2 sprays into each nostril daily.  Continue to irrigate the sinuses with saline.  I am giving you a prescription for azithromycin. Do not fill unless your symptoms fail to improve within the next couple of days or if your symptoms worsen - if you develop a fever, secretions become green colored or if you develop facial pain.  Feel free to call with any questions or concerns.

## 2013-02-22 NOTE — Progress Notes (Signed)
   Subjective:    Patient ID: Don Warner, male    DOB: Jun 10, 1950, 64 y.o.   MRN: [id number]  HPI  Cough, sinus and chest congestion ongoing for greater than 2 weeks. Started with sore throat. None now. No fever or nausea. Fatigue. He has taken guaifenesin. Yesterday felt really bad. Although reports improvement today. Cough not keeping him up.  Review of Systems Positive for cough, sinus congestion, clear secretions, chest congestion Negative for fever, chills, sore throat, shortness of breath    Objective:   Physical Exam  Constitutional: He is oriented to person, place, and time. He appears well-developed and well-nourished. No distress.  HENT:  Head: Normocephalic and atraumatic.  Nose: Nose normal.  Mouth/Throat: Oropharynx is clear and moist. No oropharyngeal exudate.  Cardiovascular: Normal rate, regular rhythm and normal heart sounds.  Exam reveals no gallop.   No murmur heard. Pulmonary/Chest: Effort normal and breath sounds normal. No respiratory distress. He has no wheezes. He has no rales.  Musculoskeletal: Normal range of motion.  Lymphadenopathy:    He has no cervical adenopathy.  Neurological: He is alert and oriented to person, place, and time.  Skin: Skin is warm and dry.  Psychiatric: He has a normal mood and affect. His behavior is normal. Judgment and thought content normal.          Assessment & Plan:

## 2013-02-23 ENCOUNTER — Ambulatory Visit: Payer: PRIVATE HEALTH INSURANCE | Admitting: Adult Health

## 2013-04-25 ENCOUNTER — Ambulatory Visit (INDEPENDENT_AMBULATORY_CARE_PROVIDER_SITE_OTHER): Payer: PRIVATE HEALTH INSURANCE | Admitting: Internal Medicine

## 2013-04-25 ENCOUNTER — Encounter: Payer: Self-pay | Admitting: Internal Medicine

## 2013-04-25 VITALS — BP 108/70 | HR 69 | Temp 97.9°F | Resp 16 | Wt 177.2 lb

## 2013-04-25 DIAGNOSIS — J302 Other seasonal allergic rhinitis: Secondary | ICD-10-CM

## 2013-04-25 DIAGNOSIS — J309 Allergic rhinitis, unspecified: Secondary | ICD-10-CM

## 2013-04-25 DIAGNOSIS — I4891 Unspecified atrial fibrillation: Secondary | ICD-10-CM

## 2013-04-25 DIAGNOSIS — Q231 Congenital insufficiency of aortic valve: Secondary | ICD-10-CM

## 2013-04-25 DIAGNOSIS — R0789 Other chest pain: Secondary | ICD-10-CM

## 2013-04-25 MED ORDER — DILTIAZEM HCL ER 60 MG PO CP12
60.0000 mg | ORAL_CAPSULE | Freq: Two times a day (BID) | ORAL | Status: DC
Start: 2013-04-25 — End: 2013-08-23

## 2013-04-25 NOTE — Patient Instructions (Addendum)
I suggest changing metoprolol to cardizem LA twice daily for control of your atrial fibrillation without dropping your blood pressure  Resume flonase for your allergies

## 2013-04-25 NOTE — Progress Notes (Signed)
Patient ID: Don Warner, male   DOB: 11/01/1950, 63 y.o.   MRN: [id number]  Patient Active Problem List   Diagnosis Date Noted  . Seasonal allergic rhinitis 04/28/2013  . Postprandial abdominal bloating 12/17/2012  . Hip pain, bilateral 12/17/2012  . Other and unspecified hyperlipidemia 12/17/2012  . Special screening for malignant neoplasm of prostate 11/07/2012  . Bicuspid aortic valve 10/13/2012  . Insomnia 01/05/2012  . Coccygeal pain 07/28/2011  . BACK PAIN 09/08/2009  . COLONIC POLYPS 10/23/2008  . Lone atrial fibrillation 10/23/2008  . BENIGN PROSTATIC HYPERTROPHY 10/23/2008    Subjective:  CC:   Chief Complaint  Patient presents with  . Acute Visit    Allergies     HPI:   Don Warner is a 63 y.o. male who presents for Recurrent chest tightness for the last several week.  feeling like he can't take a deep breath.  Thinks it may be his allergies. Has not been working out as vigorously as he has in the past.  Has atrial fibrillation but he is not taking metopolol g 12 hrs bc it would make him dizzy. episoes occurring at rest.  Follow up on chronic medical conditions including chronic lone atrial fibrillation,. Bicuspid aortic valve, gastritis vs IBS, and joint pain.  He had an annual ECHO Oct 14th by [facility], but has not received the results despite several attempts to contact [place] [facility].  GI: He did not have a colonoscopy but saw [facility] for change in bowel habits. Has been on a low fiber diet and has been having recurrent abdominal bloating post prandially.  He has tried using a commercial product called Garden of Life which reportedly has multiple digestive and nutritional enzymes which has not helped.  No unintentional wt loss,  Abdominal pain, chest pain or dark stools.  Stools are formed.   He has a new complaint of bilateral hip pain brought on by lying on one side or crossing his legs.  It is disrupting his sleep and he has had to modify his workuout because of the  pain.  There is no history of fall.       Past Medical History  Diagnosis Date  . BPH (benign prostatic hypertrophy)   . Bicuspid aortic valve   . Arrhythmia     chronic atrial fibrillation  . Atrial fibrillation     managed localy by [facility]  . Heart murmur     Past Surgical History  Procedure Laterality Date  . Tonsillectomy  8 YOA  . Tendon repair  10 YOA    Left little finger  . Meniscus repair  03/1971    Medial, left knee open  . Polypectomy  06/14/2001    Colon, internal hemorrhoids  . Upper gastrointestinal endoscopy  01/12/06  . Ett myoview  02/16/2006    Mod inferior defect, neg ischemia  . Doppler echocardiography  02/11/06    Nml  EF 60%  . Colonoscopy  2008       The following portions of the patient's history were reviewed and updated as appropriate: Allergies, current medications, and problem list.    Review of Systems:   Patient denies headache, fevers, malaise, unintentional weight loss, skin rash, eye pain, sinus congestion and sinus pain, sore throat, dysphagia,  hemoptysis , cough, dyspnea, wheezing, chest pain, palpitations, orthopnea, edema, abdominal pain, nausea, melena, diarrhea, constipation, flank pain, dysuria, hematuria, urinary  Frequency, nocturia, numbness, tingling, seizures,  Focal weakness, Loss of consciousness,  Tremor, insomnia, depression, anxiety, and suicidal ideation.  History   Social History  . Marital Status: Married    Spouse Name: N/A    Number of Children: 3  . Years of Education: N/A   Occupational History  . 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 Smokeless tobacco: Never Used  . Alcohol Use: Yes     Comment: wine daily, social drinker  . Drug Use: No  . Sexual Activity: Not on file   Other Topics Concern  . Not on file   Social History Narrative  . No narrative on file    Objective:  Filed Vitals:    04/25/13 1627  BP: 108/70  Pulse: 69  Temp: 97.9 F (36.6 C)  Resp: 16     General appearance: alert, cooperative and appears stated age Ears: normal TM's and external ear canals both ears Throat: lips, mucosa, and tongue normal; teeth and gums normal Neck: no adenopathy, no carotid bruit, supple, symmetrical, trachea midline and thyroid not enlarged, symmetric, no tenderness/mass/nodules Back: symmetric, no curvature. ROM normal. No CVA tenderness. Lungs: clear to auscultation bilaterally Heart: irregular rate and rhythm, S1, S2 normal, no murmur, click, rub or gallop Abdomen: soft, non-tender; bowel sounds normal; no masses,  no organomegaly Pulses: 2+ and symmetric Skin: Skin color, texture, turgor normal. No rashes or lesions Lymph nodes: Cervical, supraclavicular, and axillary nodes normal.  Assessment and Plan:  Lone atrial fibrillation Current feeling of chest tightness appears to be due to inadequate heart rate control on once daily lopressor, bc he has not tolerated twice daily dosing.  Trial of diltiaem.   Seasonal allergic rhinitis Advised to resume Flonase  Bicuspid aortic valve By prior ECHO.    Chest tightness His exam is suggestive of uncontrolled atrial fibrillation .  No signs of heart failure on exam. Trial of cardizem instead of metoprolol since he has been unable to tolerated bid metoprolol due to low blood pressure.    Updated Medication List Outpatient Encounter Prescriptions as of 04/25/2013  Medication Sig  . aspirin 81 MG tablet Take 81 mg by mouth daily.    . cholecalciferol (VITAMIN D) 1000 UNITS tablet Take 1,000 Units by mouth daily.  . fish oil-omega-3 fatty acids 1000 MG capsule Take 2 g by mouth daily.  . metoprolol tartrate (LOPRESSOR) 12.5 mg TABS Take 12.5 mg by mouth daily.   . Multiple Vitamins-Minerals (MULTIVITAMIN WITH MINERALS) tablet Take 1 tablet by mouth daily.  . Red Yeast Rice Extract 600 MG CAPS Take 1 capsule by mouth 2 (two)  times daily.  Marland Kitchen azithromycin (ZITHROMAX) 250 MG tablet Take 2 tablets today then 1 tablet daily for the next 4 days.  Marland Kitchen diltiazem (CARDIZEM SR) 60 MG 12 hr capsule Take 1 capsule (60 mg total) by mouth 2 (two) times daily.  . fluticasone (FLONASE) 50 MCG/ACT nasal spray Place 2 sprays into both nostrils daily.

## 2013-04-25 NOTE — Progress Notes (Signed)
Pre-visit discussion using our clinic review tool. No additional management support is needed unless otherwise documented below in the visit note.  

## 2013-04-28 ENCOUNTER — Encounter: Payer: Self-pay | Admitting: Internal Medicine

## 2013-04-28 DIAGNOSIS — R0789 Other chest pain: Secondary | ICD-10-CM | POA: Insufficient documentation

## 2013-04-28 DIAGNOSIS — J302 Other seasonal allergic rhinitis: Secondary | ICD-10-CM | POA: Insufficient documentation

## 2013-04-28 NOTE — Assessment & Plan Note (Signed)
Current feeling of chest tightness appears to be due to inadequate heart rate control on once daily lopressor, bc he has not tolerated twice daily dosing.  Trial of diltiaem.

## 2013-04-28 NOTE — Assessment & Plan Note (Signed)
Advised to resume Flonase

## 2013-04-28 NOTE — Assessment & Plan Note (Signed)
His exam is suggestive of uncontrolled atrial fibrillation .  No signs of heart failure on exam. Trial of cardizem instead of metoprolol since he has been unable to tolerated bid metoprolol due to low blood pressure.

## 2013-04-28 NOTE — Assessment & Plan Note (Signed)
By prior ECHO.

## 2013-08-22 ENCOUNTER — Telehealth: Payer: Self-pay | Admitting: Internal Medicine

## 2013-08-22 NOTE — Telephone Encounter (Signed)
Pt send a message to schedule a visit for a rash. The rash is getting better but he is still having the same breathing issues that he had previously discussed with Dr. Derrel Nip. Pt wants to be seen for breathing issues. Please advise where to add pt/msn

## 2013-08-22 NOTE — Telephone Encounter (Signed)
Patient stated changed Metoprolol, and patient is having problem taking a deep breathing and patient has been researching Internet and worries if this could be a PE. After discussing with MD was advised to schedule patient for 08/23/13 patient scheduled and notified.

## 2013-08-23 ENCOUNTER — Encounter: Payer: Self-pay | Admitting: Internal Medicine

## 2013-08-23 ENCOUNTER — Ambulatory Visit (INDEPENDENT_AMBULATORY_CARE_PROVIDER_SITE_OTHER): Payer: PRIVATE HEALTH INSURANCE | Admitting: Internal Medicine

## 2013-08-23 VITALS — BP 108/60 | HR 68 | Resp 12 | Ht 70.0 in | Wt 176.8 lb

## 2013-08-23 DIAGNOSIS — R0789 Other chest pain: Secondary | ICD-10-CM

## 2013-08-23 DIAGNOSIS — S22000A Wedge compression fracture of unspecified thoracic vertebra, initial encounter for closed fracture: Secondary | ICD-10-CM

## 2013-08-23 DIAGNOSIS — S22009A Unspecified fracture of unspecified thoracic vertebra, initial encounter for closed fracture: Secondary | ICD-10-CM

## 2013-08-23 NOTE — Progress Notes (Signed)
Pre visit review using our clinic review tool, if applicable. No additional management support is needed unless otherwise documented below in the visit note. 

## 2013-08-23 NOTE — Progress Notes (Signed)
Patient ID: Don Warner, male   DOB: [birth year], 63 y.o.   MRN: [id number]   Patient Active Problem List   Diagnosis Date Noted  . Thoracic compression fracture 08/25/2013  . Seasonal allergic rhinitis 04/28/2013  . Chest tightness 04/28/2013  . Postprandial abdominal bloating 12/17/2012  . Hip pain, bilateral 12/17/2012  . Other and unspecified hyperlipidemia 12/17/2012  . Special screening for malignant neoplasm of prostate 11/07/2012  . Bicuspid aortic valve 10/13/2012  . Insomnia 01/05/2012  . Coccygeal pain 07/28/2011  . BACK PAIN 09/08/2009  . COLONIC POLYPS 10/23/2008  . Lone atrial fibrillation 10/23/2008  . BENIGN PROSTATIC HYPERTROPHY 10/23/2008    Subjective:  CC:   No chief complaint on file.   HPI:   Don Warner is a 63 y.o. male who presents for Trouble taking a deep breath, for the past several months.    NOT SHORT OF BREATH.  Has atrial fib and has noticed decreased exercise tolerance.  denies GERD .  But feels like he just had a big meal and can't take a deep breath. No recent travel or immobilization or surgery.  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 Friend was diangosed with COPD /hyperinflation and thinks he has the same  Pursed lip breathing exercise demonstrated.  Did not resolve the feeling of not being able to take a deep breath.     Past Medical History  Diagnosis Date  . BPH (benign prostatic hypertrophy)   . Bicuspid aortic valve   . Arrhythmia     chronic atrial fibrillation  . Atrial fibrillation     managed localy by [facility]  . Heart murmur     Past Surgical History  Procedure Laterality Date  . Tonsillectomy  8 YOA  . Tendon repair  10 YOA    Left little finger  . Meniscus repair  03/1971    Medial, left knee open  . Polypectomy  06/14/2001    Colon, internal hemorrhoids  . Upper gastrointestinal endoscopy  01/12/06  . Ett myoview  02/16/2006    Mod inferior defect, neg ischemia  . Doppler  echocardiography  02/11/06    Nml  EF 60%  . Colonoscopy  2008       The following portions of the patient's history were reviewed and updated as appropriate: Allergies, current medications, and problem list.    Review of Systems:   Patient denies headache, fevers, malaise, unintentional weight loss, skin rash, eye pain, sinus congestion and sinus pain, sore throat, dysphagia,  hemoptysis , cough, dyspnea, wheezing, chest pain, palpitations, orthopnea, edema, abdominal pain, nausea, melena, diarrhea, constipation, flank pain, dysuria, hematuria, urinary  Frequency, nocturia, numbness, tingling, seizures,  Focal weakness, Loss of consciousness,  Tremor, insomnia, depression, anxiety, and suicidal ideation.     History   Social History  . Marital Status: Married    Spouse Name: N/A    Number of Children: 3  . Years of Education: N/A   Occupational History  . 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 Smokeless tobacco: Never Used  . Alcohol Use: Yes     Comment: wine daily, social drinker  . Drug Use: No  . Sexual Activity: Not on file   Other Topics Concern  . Not on file   Social History Narrative  . No narrative on file    Objective:  Filed Vitals:   08/23/13 1135  BP: 108/60  Pulse: 68  Resp: 12     General appearance: alert, cooperative and appears stated age Ears: normal TM's and external ear canals both ears Throat: lips, mucosa, and tongue normal; teeth and gums normal Neck: no adenopathy, no carotid bruit, supple, symmetrical, trachea midline and thyroid not enlarged, symmetric, no tenderness/mass/nodules Back: symmetric, no curvature. ROM normal. No CVA tenderness. Lungs: clear to auscultation bilaterally Heart: regular rate and rhythm, S1, S2 normal, no murmur, click, rub or gallop Abdomen: soft, non-tender; bowel sounds normal; no masses,  no organomegaly Pulses:  2+ and symmetric Skin: Skin color, texture, turgor normal. No rashes or lesions Lymph nodes: Cervical, supraclavicular, and axillary nodes normal.  Assessment and Plan:  Chest tightness Chronic,  With normal chest x ray today, no signs of hyperinflation noted  Consider constrictive lung disease.  PFTS and pulmonary evaluation pending   Thoracic compression fracture Incidental finding on chest x ray,  screenign for osteoporosis recommended.   Updated Medication List Outpatient Encounter Prescriptions as of 08/23/2013  Medication Sig  . aspirin 81 MG tablet Take 81 mg by mouth daily.    . cholecalciferol (VITAMIN D) 1000 UNITS tablet Take 1,000 Units by mouth daily.  . fish oil-omega-3 fatty acids 1000 MG capsule Take 2 g by mouth daily.  . fluticasone (FLONASE) 50 MCG/ACT nasal spray Place 2 sprays into both nostrils daily.  . metoprolol tartrate (LOPRESSOR) 12.5 mg TABS Take 12.5 mg by mouth daily as needed.   . Multiple Vitamins-Minerals (MULTIVITAMIN WITH MINERALS) tablet Take 1 tablet by mouth daily.  . Red Yeast Rice Extract 600 MG CAPS Take 1 capsule by mouth 2 (two) times daily.  . [DISCONTINUED] azithromycin (ZITHROMAX) 250 MG tablet Take 2 tablets today then 1 tablet daily for the next 4 days.  . [DISCONTINUED] diltiazem (CARDIZEM SR) 60 MG 12 hr capsule Take 1 capsule (60 mg total) by mouth 2 (two) times daily.     Orders Placed This Encounter  Procedures  . DG Chest 2 View  . DG Bone Density  . Ambulatory referral to Pulmonology  . Pulmonary function test    No Follow-up on file.

## 2013-08-24 ENCOUNTER — Encounter: Payer: Self-pay | Admitting: Internal Medicine

## 2013-08-24 ENCOUNTER — Ambulatory Visit (INDEPENDENT_AMBULATORY_CARE_PROVIDER_SITE_OTHER)
Admission: RE | Admit: 2013-08-24 | Discharge: 2013-08-24 | Disposition: A | Discharge status: Home / Self Care | Payer: PRIVATE HEALTH INSURANCE | Source: Ambulatory Visit | Attending: Internal Medicine | Admitting: Internal Medicine

## 2013-08-24 DIAGNOSIS — R0789 Other chest pain: Secondary | ICD-10-CM

## 2013-08-25 DIAGNOSIS — S22000A Wedge compression fracture of unspecified thoracic vertebra, initial encounter for closed fracture: Secondary | ICD-10-CM | POA: Insufficient documentation

## 2013-08-25 NOTE — Assessment & Plan Note (Signed)
Chronic,  With normal chest x ray today, no signs of hyperinflation noted  Consider constrictive lung disease.  PFTS and pulmonary evaluation pending

## 2013-08-25 NOTE — Assessment & Plan Note (Signed)
Incidental finding on chest x ray,  screenign for osteoporosis recommended.

## 2013-08-29 ENCOUNTER — Encounter: Payer: Self-pay | Admitting: Internal Medicine

## 2013-09-07 ENCOUNTER — Ambulatory Visit (INDEPENDENT_AMBULATORY_CARE_PROVIDER_SITE_OTHER): Payer: PRIVATE HEALTH INSURANCE | Admitting: Cardiovascular Disease

## 2013-09-07 ENCOUNTER — Encounter: Payer: Self-pay | Admitting: Cardiovascular Disease

## 2013-09-07 VITALS — BP 112/72 | HR 69 | Ht 70.0 in | Wt 175.5 lb

## 2013-09-07 DIAGNOSIS — E785 Hyperlipidemia, unspecified: Secondary | ICD-10-CM

## 2013-09-07 DIAGNOSIS — I4891 Unspecified atrial fibrillation: Secondary | ICD-10-CM

## 2013-09-07 DIAGNOSIS — I4821 Permanent atrial fibrillation: Secondary | ICD-10-CM | POA: Insufficient documentation

## 2013-09-07 DIAGNOSIS — G4700 Insomnia, unspecified: Secondary | ICD-10-CM

## 2013-09-07 DIAGNOSIS — Q231 Congenital insufficiency of aortic valve: Secondary | ICD-10-CM

## 2013-09-07 DIAGNOSIS — R0602 Shortness of breath: Secondary | ICD-10-CM

## 2013-09-07 NOTE — Progress Notes (Signed)
Patient ID: ANJEL PARDO, male    DOB: [birth year], 63 y.o.   MRN: [id number]  HPI Comments: Mr. [name] is a pleasant 63 year old gentleman with long history of atrial fibrillation, mitral valve prolapse, mitral valve regurgitation, suspected bicuspid aortic valve with mild stenosis, hyperlipidemia who presents to establish care in the [place] office.  He is a patient of Dr. Derrel Nip He reports that recently he has noticed increasing shortness of breath. He is typically very active, but she is a high level of exercise. He reports doing martial arts when he was younger, does running a regular basis. More recently has had mild shortness of breath and unable to achieve a peak exercise threshold. He denies any lower extremity edema, no cough, no significant weight gain apart from several pounds from eating more. He does report that his weight is up 7 pounds as he is not able to exercise as intensely. He is been not eating well either and reports that he could probably do much better with his diet.  He is only taking metoprolol tartrate 12.5 milligrams when necessary for tachycardia  He details a long history of evaluation by cardiology for his atrial fibrillation. Previously seen at [facility] among other institutions as well as through our group, Dr. Caryl Comes many years ago. Atrial fibrillation dates back to 1999. He's been maintained on low-dose aspirin with no TIA or stroke symptoms.  Cholesterol 2013 was 234. Followup blood work in 2014 was lost and is not in our system  In terms of his prior family history, father had coronary artery disease, bypass surgery at age 5, died at age 85 Mother had dementia, other relatives with stroke  Echocardiogram October 2014 was reviewed. This showed normal ejection fraction with moderate MR, mild to moderate TR, mild AI, mild aortic valve stenosis with possible bicuspid aortic valve, borderline mitral valve prolapse  EKG shows atrial fibrillation with rate  69 beats per minute, no significant ST or T wave changes    Outpatient Encounter Prescriptions as of 09/07/2013  Medication Sig  . aspirin 81 MG tablet Take 81 mg by mouth daily.    . cholecalciferol (VITAMIN D) 1000 UNITS tablet Take 1,000 Units by mouth daily.  . fish oil-omega-3 fatty acids 1000 MG capsule Take 2 g by mouth daily.  . fluticasone (FLONASE) 50 MCG/ACT nasal spray Place 2 sprays into both nostrils daily.  . metoprolol tartrate (LOPRESSOR) 12.5 mg TABS Take 12.5 mg by mouth daily as needed.   . Multiple Vitamins-Minerals (MULTIVITAMIN WITH MINERALS) tablet Take 1 tablet by mouth daily.  . Red Yeast Rice Extract 600 MG CAPS Take 1 capsule by mouth 2 (two) times daily.     Review of Systems  Constitutional: Negative.   HENT: Negative.   Eyes: Negative.   Respiratory: Positive for shortness of breath.   Cardiovascular: Negative.   Gastrointestinal: Negative.   Endocrine: Negative.   Musculoskeletal: Negative.   Skin: Negative.   Allergic/Immunologic: Negative.   Neurological: Negative.   Hematological: Negative.   Psychiatric/Behavioral: Negative.   All other systems reviewed and are negative.   BP 112/72  Pulse 69  Ht 5\' 10"  (1.778 m)  Wt 175 lb 8 oz (79.606 kg)  BMI 25.18 kg/m2  Physical Exam  Nursing note and vitals reviewed. Constitutional: He is oriented to person, place, and time. He appears well-developed and well-nourished.  HENT:  Head: Normocephalic.  Nose: Nose normal.  Mouth/Throat: Oropharynx is clear and moist.  Eyes: Conjunctivae are normal. Pupils are  equal, round, and reactive to light.  Neck: Normal range of motion. Neck supple. No JVD present.  Cardiovascular: Normal rate, regular rhythm, S1 normal, S2 normal, normal heart sounds and intact distal pulses.  Exam reveals no gallop and no friction rub.   No murmur heard. Pulmonary/Chest: Effort normal and breath sounds normal. No respiratory distress. He has no wheezes. He has no rales. He  exhibits no tenderness.  Abdominal: Soft. Bowel sounds are normal. He exhibits no distension. There is no tenderness.  Musculoskeletal: Normal range of motion. He exhibits no edema and no tenderness.  Lymphadenopathy:    He has no cervical adenopathy.  Neurological: He is alert and oriented to person, place, and time. Coordination normal.  Skin: Skin is warm and dry. No rash noted. No erythema.  Psychiatric: He has a normal mood and affect. His behavior is normal. Judgment and thought content normal.      Assessment and Plan

## 2013-09-07 NOTE — Assessment & Plan Note (Signed)
Long history of atrial fibrillation initially paroxysmal, now chronic. He is on low-dose aspirin. He reports having discussions previously with EP at [facility] as well as other [place]. Is not interested in more aggressive anticoagulation. ChadsVASC score is very low

## 2013-09-07 NOTE — Assessment & Plan Note (Signed)
Periods of insomnia, sometimes from underlying tachycardia. Suggested he try metoprolol tartrate 12.5 mg twice a day on a regular basis. He reports after he takes metoprolol, he is able to go back to bed

## 2013-09-07 NOTE — Assessment & Plan Note (Signed)
Suspected bicuspid aortic valve on echocardiogram October 2014. No prior TEE. Mild AS and AI. This can be monitored by periodic echocardiogram. There is no mention of dilated aortic root of the echocardiogram.

## 2013-09-07 NOTE — Patient Instructions (Signed)
You are doing well.  Ok to try metoprolol succinate 1/2 pill daily for palpitations/shortness of breath  Please call us if you have new issues that need to be addressed before your next appt.  Your physician wants you to follow-up in: 12 months.  You will receive a reminder letter in the mail two months in advance. If you don't receive a letter, please call our office to schedule the follow-up appointment.

## 2013-09-07 NOTE — Assessment & Plan Note (Addendum)
Shortness is likely multifactorial including underlying atrial fibrillation, mild aortic valve stenosis, moderate mitral valve regurgitation by echocardiogram in October 2014, recent weight gain from less exercise and eating poorly. Unable to exclude diastolic CHF from underlying cardiac pathology as detailed above.   He reports symptoms have been better recently. We did offer a diuretic for him to take when necessary or worsening symptoms. He will call us if he would like a prescription.   Also suggested he try to take his metoprolol on a consistent basis for heart rate control. He took beta blocker today with good heart rate control. He reports having fast heart rate with exercise and sometimes in the middle of the night. He may do better on metoprolol 12.5 mg twice a day on a consistent basis

## 2013-09-07 NOTE — Assessment & Plan Note (Addendum)
Prior cholesterol 234. Very strong family history of coronary artery disease, father with bypass surgery at 16. Recommended we repeat his lipid panel. Lab ordered provided

## 2013-09-12 ENCOUNTER — Institutional Professional Consult (permissible substitution): Payer: PRIVATE HEALTH INSURANCE | Admitting: Internal Medicine

## 2013-10-04 ENCOUNTER — Ambulatory Visit: Payer: PRIVATE HEALTH INSURANCE | Admitting: Internal Medicine

## 2014-02-12 ENCOUNTER — Telehealth: Payer: Self-pay | Admitting: Cardiovascular Disease

## 2014-02-12 NOTE — Telephone Encounter (Signed)
Pt is stating he would like to come in and is a chronic Afib and lately its been bothering him more.   Tired, knows it could be medication.   Please advise.

## 2014-02-12 NOTE — Telephone Encounter (Signed)
Spoke w/ Don Warner.  He states that he would like to discuss w/ Dr. Rockey Situ his options, as he is "tired of being tired" and would like to discuss his options for his chronic afib.  Don Warner sched to see Dr. Rockey Situ tomorrow at 11:15.

## 2014-02-13 ENCOUNTER — Encounter: Payer: Self-pay | Admitting: Cardiovascular Disease

## 2014-02-13 ENCOUNTER — Ambulatory Visit (INDEPENDENT_AMBULATORY_CARE_PROVIDER_SITE_OTHER): Payer: PRIVATE HEALTH INSURANCE | Admitting: Cardiovascular Disease

## 2014-02-13 VITALS — BP 100/80 | HR 68 | Ht 70.0 in | Wt 177.2 lb

## 2014-02-13 DIAGNOSIS — R0602 Shortness of breath: Secondary | ICD-10-CM

## 2014-02-13 DIAGNOSIS — I482 Chronic atrial fibrillation: Secondary | ICD-10-CM

## 2014-02-13 DIAGNOSIS — I4821 Permanent atrial fibrillation: Secondary | ICD-10-CM

## 2014-02-13 DIAGNOSIS — E785 Hyperlipidemia, unspecified: Secondary | ICD-10-CM | POA: Insufficient documentation

## 2014-02-13 DIAGNOSIS — Q231 Congenital insufficiency of aortic valve: Secondary | ICD-10-CM

## 2014-02-13 DIAGNOSIS — I4891 Unspecified atrial fibrillation: Secondary | ICD-10-CM

## 2014-02-13 NOTE — Assessment & Plan Note (Signed)
Minimal murmur appreciated on exam. No further workup at this time, relatively asymptomatic

## 2014-02-13 NOTE — Assessment & Plan Note (Signed)
Breathing is stable, likely secondary to underlying arrhythmia. No further testing

## 2014-02-13 NOTE — Assessment & Plan Note (Signed)
Previous cholesterol was elevated. Not done in 2015. Order placed to repeat his blood work

## 2014-02-13 NOTE — Patient Instructions (Signed)
You are doing well. No medication changes were made.  We will check blood work, fasting  Please call us if you have new issues that need to be addressed before your next appt.  Your physician wants you to follow-up in: 12 months.  You will receive a reminder letter in the mail two months in advance. If you don't receive a letter, please call our office to schedule the follow-up appointment.

## 2014-02-13 NOTE — Progress Notes (Signed)
Patient ID: Don Warner, male    DOB: 1950/07/06, 64 y.o.   MRN: [id number]  HPI Comments: Don Warner is a pleasant 64 year old gentleman with long history of permanent atrial fibrillation, mitral valve prolapse, mitral valve regurgitation, suspected bicuspid aortic valve with mild stenosis, hyperlipidemia who presents for routine follow-up of his atrial fibrillation. patient of Dr. Derrel Nip  In follow-up today, he reports that he feels about the same. Still some shortness of breath with heavy exertion. Weight is up but only slightly. He is active but does not exercise like he used to. Otherwise no complaints. No TIA or stroke type symptoms. Tolerating aspirin He is only taking metoprolol tartrate 12.5 milligrams when necessary for tachycardia  EKG on today's visit shows atrial fibrillation with ventricular rate 63 bpm, nonspecific ST abnormality  Other past medical history Previous significant evaluation by  cardiology for his atrial fibrillation. Previously seen at [facility] among other institutions as well as through our group, Dr. Caryl Comes many years ago. Atrial fibrillation dates back to 1999. He's been maintained on low-dose aspirin with no TIA or stroke symptoms.  In terms of his prior family history, father had coronary artery disease, bypass surgery at age 64, died at age 64 Mother had dementia, other relatives with stroke  Echocardiogram October 2014 was reviewed. This showed normal ejection fraction with moderate MR, mild to moderate TR, mild AI, mild aortic valve stenosis with possible bicuspid aortic valve, borderline mitral valve prolapse    Allergies  Allergen Reactions  . Penicillins     REACTION: Itching and hives  . Quinolones     Joint pain, disoriented    Outpatient Encounter Prescriptions as of 02/13/2014  Medication Sig  . aspirin 81 MG tablet Take 81 mg by mouth daily.    . cholecalciferol (VITAMIN D) 1000 UNITS tablet Take 1,000 Units by mouth daily.  .  metoprolol tartrate (LOPRESSOR) 12.5 mg TABS Take 12.5 mg by mouth daily as needed.   . [DISCONTINUED] fish oil-omega-3 fatty acids 1000 MG capsule Take 2 g by mouth daily.  . [DISCONTINUED] fluticasone (FLONASE) 50 MCG/ACT nasal spray Place 2 sprays into both nostrils daily. (Patient not taking: Reported on 02/13/2014)  . [DISCONTINUED] Multiple Vitamins-Minerals (MULTIVITAMIN WITH MINERALS) tablet Take 1 tablet by mouth daily.  . [DISCONTINUED] Red Yeast Rice Extract 600 MG CAPS Take 1 capsule by mouth 2 (two) times daily.    Past Medical History  Diagnosis Date  . BPH (benign prostatic hypertrophy)   . Bicuspid aortic valve   . Arrhythmia     chronic atrial fibrillation  . Atrial fibrillation     managed localy by [facility]  . Heart murmur     Past Surgical History  Procedure Laterality Date  . Tonsillectomy  8 YOA  . Tendon repair  10 YOA    Left little finger  . Meniscus repair  03/1971    Medial, left knee open  . Polypectomy  06/14/2001    Colon, internal hemorrhoids  . Upper gastrointestinal endoscopy  01/12/06  . Ett myoview  02/16/2006    Mod inferior defect, neg ischemia  . Doppler echocardiography  02/11/06    Nml  EF 60%  . Colonoscopy  2008    Social History  reports that he quit smoking about 40 years ago. He has never used smokeless tobacco. He reports that he drinks alcohol. He reports that he does not use illicit drugs.  Family History family history includes Alzheimer's disease in his mother; Cancer in  his brother; Depression in his brother and mother; Diabetes in his other; Heart disease in his paternal grandfather; Heart disease (age of onset: 64) in his father. There is no history of Hypertension, Alcohol abuse, or Drug abuse.   Review of Systems  Constitutional: Negative.   HENT: Negative.   Respiratory: Positive for shortness of breath.   Cardiovascular: Negative.   Gastrointestinal: Negative.   Musculoskeletal: Negative.   Skin: Negative.    Neurological: Negative.   Hematological: Negative.   Psychiatric/Behavioral: Negative.   All other systems reviewed and are negative.   BP 100/80 mmHg  Pulse 68  Ht 5\' 10"  (1.778 m)  Wt 177 lb 4 oz (80.4 kg)  BMI 25.43 kg/m2  Physical Exam  Constitutional: He is oriented to person, place, and time. He appears well-developed and well-nourished.  HENT:  Head: Normocephalic.  Nose: Nose normal.  Mouth/Throat: Oropharynx is clear and moist.  Eyes: Conjunctivae are normal. Pupils are equal, round, and reactive to light.  Neck: Normal range of motion. Neck supple. No JVD present.  Cardiovascular: Normal rate, regular rhythm, S1 normal, S2 normal, normal heart sounds and intact distal pulses.  Exam reveals no gallop and no friction rub.   No murmur heard. Pulmonary/Chest: Effort normal and breath sounds normal. No respiratory distress. He has no wheezes. He has no rales. He exhibits no tenderness.  Abdominal: Soft. Bowel sounds are normal. He exhibits no distension. There is no tenderness.  Musculoskeletal: Normal range of motion. He exhibits no edema or tenderness.  Lymphadenopathy:    He has no cervical adenopathy.  Neurological: He is alert and oriented to person, place, and time. Coordination normal.  Skin: Skin is warm and dry. No rash noted. No erythema.  Psychiatric: He has a normal mood and affect. His behavior is normal. Judgment and thought content normal.      Assessment and Plan   Nursing note and vitals reviewed.

## 2014-02-13 NOTE — Assessment & Plan Note (Signed)
Tolerating his arrhythmia, on aspirin. Does not want more aggressive anticoagulation as he bruises easily. No TIA or stroke type symptoms

## 2014-04-08 ENCOUNTER — Encounter: Payer: Self-pay | Admitting: Cardiovascular Disease

## 2014-05-05 ENCOUNTER — Encounter: Payer: Self-pay | Admitting: Nurse Practitioner

## 2014-05-06 ENCOUNTER — Ambulatory Visit: Payer: PRIVATE HEALTH INSURANCE | Admitting: Nurse Practitioner

## 2014-06-10 ENCOUNTER — Encounter: Payer: Self-pay | Admitting: Internal Medicine

## 2014-06-10 ENCOUNTER — Ambulatory Visit (INDEPENDENT_AMBULATORY_CARE_PROVIDER_SITE_OTHER): Payer: PRIVATE HEALTH INSURANCE | Admitting: Internal Medicine

## 2014-06-10 VITALS — BP 112/78 | HR 87 | Temp 98.4°F | Resp 16 | Ht 70.0 in | Wt 177.5 lb

## 2014-06-10 DIAGNOSIS — R5382 Chronic fatigue, unspecified: Secondary | ICD-10-CM

## 2014-06-10 DIAGNOSIS — R5381 Other malaise: Secondary | ICD-10-CM

## 2014-06-10 NOTE — Patient Instructions (Signed)
Please get your fasting las done at [facility] when convenient  If everything is normal consider letting me set you up for a sleep study  (Or an antidepressant )  [place]!

## 2014-06-10 NOTE — Progress Notes (Signed)
Pre-visit discussion using our clinic review tool. No additional management support is needed unless otherwise documented below in the visit note.  

## 2014-06-10 NOTE — Progress Notes (Signed)
Patient ID: Don Warner, male   DOB: 10/04/1950, 64 y.o.   MRN: [id number]   Patient Active Problem List   Diagnosis Date Noted  . Chronic fatigue and malaise 06/11/2014  . Hyperlipidemia 02/13/2014  . Shortness of breath 09/07/2013  . Permanent atrial fibrillation 09/07/2013  . Thoracic compression fracture 08/25/2013  . Seasonal allergic rhinitis 04/28/2013  . Chest tightness 04/28/2013  . Postprandial abdominal bloating 12/17/2012  . Hip pain, bilateral 12/17/2012  . Other and unspecified hyperlipidemia 12/17/2012  . Special screening for malignant neoplasm of prostate 11/07/2012  . Bicuspid aortic valve 10/13/2012  . Insomnia 01/05/2012  . Coccygeal pain 07/28/2011  . BACK PAIN 09/08/2009  . COLONIC POLYPS 10/23/2008  . Lone atrial fibrillation 10/23/2008  . BENIGN PROSTATIC HYPERTROPHY 10/23/2008    Subjective:  CC:   Chief Complaint  Patient presents with  . Acute Visit    loses balance stated not dizzty, feels like equillibrium issue.  . Fatigue    wake up tired hard to get going.    HPI:   Don Warner is a 64 y.o. male who presents for  Multiple somatic complaints,  Chronic.    He continues to note loss of balance with sudden changes in position.   Not with rolling over in bed, more with bending over  And standing up from a couching position. Aggravated by bright sunlight as well.  Drinks 1 to 2  20 ounce bottles of water daily . Doing a lot of physical labor in the back yard lately.  Feels tired all the time. Frustrated that he can only do about 3 hours of physical labor before tiring out.    Using claritin for allergic rhinitis.   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  pateint advocated having it amputated, surgeon did not agree.  Has bilateral trigger fingers  Involving the middle fingers of  left hand ,  wants referral to hand surgeon    Feels bad every morning.  "slow starter"  Symptoms are ill defined,  But everything is better, including his mood ,  After he has his cup of coffee.  Wife wants him to have a sleep study but he is not interested.  Sleeping in a recliner because everything feels better (back pain)   Past Medical History  Diagnosis Date  . BPH (benign prostatic hypertrophy)   . Bicuspid aortic valve   . Arrhythmia     chronic atrial fibrillation  . Atrial fibrillation     managed localy by [facility]  . Heart murmur     Past Surgical History  Procedure Laterality Date  . Tonsillectomy  8 YOA  . Tendon repair  10 YOA    Left little finger  . Meniscus repair  03/1971    Medial, left knee open  . Polypectomy  06/14/2001    Colon, internal hemorrhoids  . Upper gastrointestinal endoscopy  01/12/06  . Ett myoview  02/16/2006    Mod inferior defect, neg ischemia  . Doppler echocardiography  02/11/06    Nml  EF 60%  . Colonoscopy  2008       The following portions of the patient's history were reviewed and updated as appropriate: Allergies, current medications, and problem list.    Review of Systems:   Patient denies headache, fevers, malaise, unintentional weight loss, skin rash, eye pain, sinus congestion and sinus pain, sore throat, dysphagia,  hemoptysis , cough, dyspnea, wheezing, chest pain, palpitations, orthopnea, edema, abdominal pain, nausea, melena, diarrhea, constipation, flank pain, dysuria, hematuria, urinary  Frequency, nocturia, numbness, tingling, seizures,  Focal weakness, Loss of consciousness,  Tremor, insomnia, depression, anxiety, and suicidal ideation.     History   Social History  . Marital Status: Married    Spouse Name: N/A  . Number of Children: 3  . Years of Education: N/A   Occupational History  . 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 Smoking status: Former Smoker    Quit date: 10/07/1973  . Smokeless tobacco: Never Used  .  Alcohol Use: Yes     Comment: wine daily, social drinker  . Drug Use: No  . Sexual Activity: Not on file   Other Topics Concern  . Not on file   Social History Narrative    Objective:  Filed Vitals:   06/10/14 1031  BP: 112/78  Pulse: 87  Temp: 98.4 F (36.9 C)  Resp: 16     General appearance: alert, cooperative and appears stated age Ears: normal TM's and external ear canals both ears Throat: lips, mucosa, and tongue normal; teeth and gums normal Neck: no adenopathy, no carotid bruit, supple, symmetrical, trachea midline and thyroid not enlarged, symmetric, no tenderness/mass/nodules Back: symmetric, no curvature. ROM normal. No CVA tenderness. Lungs: clear to auscultation bilaterally Heart: regular rate and rhythm, S1, S2 normal, no murmur, click, rub or gallop Abdomen: soft, non-tender; bowel sounds normal; no masses,  no organomegaly Pulses: 2+ and symmetric Skin: Skin color, texture, turgor normal. No rashes or lesions Lymph nodes: Cervical, supraclavicular, and axillary nodes normal.  Assessment and Plan:  Chronic fatigue and malaise Long discussion of his symptoms today, which he feels are all due to his atrial fibrillation, which is not amenable to surgery.  He is not orthostatic by my own exam today, and does not want ot have a sleep study.  He is not receptive to the idea of a diagnosis of  depression with some anxiety features,  And will not consider SSRI therapy.  Will screen , again, for thyroid, anemia, etc.    A total of 40 minutes was spent with patient more than half of which was spent in counseling patient on the above mentioned issues , reviewing prior workup and  labs and coordination of care. Updated Medication List Outpatient Encounter Prescriptions as of 06/10/2014  Medication Sig  . aspirin 81 MG tablet Take 81 mg by mouth daily.    Marland Kitchen co-enzyme Q-10 50 MG capsule Take 50 mg by mouth 2 (two) times daily.  . metoprolol tartrate (LOPRESSOR) 12.5 mg  TABS Take 12.5 mg by mouth daily as needed.   . Multiple Vitamins-Minerals (MULTIVITAMIN WITH MINERALS) tablet Take 1 tablet by mouth daily.  . cholecalciferol (VITAMIN D) 1000 UNITS tablet Take 1,000 Units by mouth daily.   No facility-administered encounter medications on file as of 06/10/2014.     No orders of the defined types were placed in this encounter.    Return in about 6 months (around 12/11/2014).

## 2014-06-11 DIAGNOSIS — R5381 Other malaise: Secondary | ICD-10-CM | POA: Insufficient documentation

## 2014-06-11 DIAGNOSIS — R5382 Chronic fatigue, unspecified: Principal | ICD-10-CM

## 2014-06-11 NOTE — Assessment & Plan Note (Signed)
Long discussion of his symptoms today, which he feels are all due to his atrial fibrillation, which is not amenable to surgery.  He is not orthostatic by my own exam today, and does not want ot have a sleep study.  He is not receptive to the idea of a diagnosis of  depression with some anxiety features,  And will not consider SSRI therapy.  Will screen , again, for thyroid, anemia, etc.

## 2014-07-31 ENCOUNTER — Ambulatory Visit: Payer: PRIVATE HEALTH INSURANCE | Admitting: Cardiovascular Disease

## 2014-11-01 ENCOUNTER — Ambulatory Visit (INDEPENDENT_AMBULATORY_CARE_PROVIDER_SITE_OTHER): Payer: PRIVATE HEALTH INSURANCE | Admitting: Cardiovascular Disease

## 2014-11-01 ENCOUNTER — Encounter: Payer: Self-pay | Admitting: Cardiovascular Disease

## 2014-11-01 VITALS — BP 100/72 | HR 71 | Ht 70.0 in | Wt 176.8 lb

## 2014-11-01 DIAGNOSIS — I4821 Permanent atrial fibrillation: Secondary | ICD-10-CM

## 2014-11-01 DIAGNOSIS — R5382 Chronic fatigue, unspecified: Secondary | ICD-10-CM | POA: Diagnosis not present

## 2014-11-01 DIAGNOSIS — I4891 Unspecified atrial fibrillation: Secondary | ICD-10-CM | POA: Diagnosis not present

## 2014-11-01 DIAGNOSIS — I38 Endocarditis, valve unspecified: Secondary | ICD-10-CM

## 2014-11-01 DIAGNOSIS — R5381 Other malaise: Secondary | ICD-10-CM

## 2014-11-01 DIAGNOSIS — R0789 Other chest pain: Secondary | ICD-10-CM | POA: Diagnosis not present

## 2014-11-01 DIAGNOSIS — R0602 Shortness of breath: Secondary | ICD-10-CM

## 2014-11-01 DIAGNOSIS — I482 Chronic atrial fibrillation: Secondary | ICD-10-CM

## 2014-11-01 NOTE — Assessment & Plan Note (Signed)
Unable to exclude depression as a cause of his symptoms Heart rate relatively well-controlled. He has had atrial fibrillation for many years Currently not exercising, no motivation

## 2014-11-01 NOTE — Assessment & Plan Note (Signed)
Breath at baseline, seems to be a chronic issue. Echocardiogram pending otherwise no other workup at this time

## 2014-11-01 NOTE — Assessment & Plan Note (Signed)
Prior echocardiogram in 2014 with moderate MR, possible bicuspid aortic valve Given his general malaise, repeat echocardiogram ordered

## 2014-11-01 NOTE — Progress Notes (Signed)
Patient ID: Don Warner, male    DOB: Nov 21, 1950, 64 y.o.   MRN: [id number]  HPI Comments: [name] is a pleasant 64 year old gentleman with long history of permanent atrial fibrillation, mitral valve prolapse, mitral valve regurgitation, suspected bicuspid aortic valve with mild stenosis, hyperlipidemia who presents for routine follow-up of his atrial fibrillation. patient of Dr. Derrel Nip Prior echogram suggesting moderate MR (read by Dr. Clayborn Bigness in 2014)  In follow-up today, he reports that he does not feel as well. He has not been exercising, no motivation to go to the gym. He is more tired easily. Has been doing projects around his house, such as building a deck or two. Does not think his heart rate has been elevated, unclear if he has extra fluid buildup but finds it more difficult to breathe at times. He is unclear if there is a new issue or he is just getting older He is only taking metoprolol tartrate 12.5 milligrams when necessary for tachycardia  EKG on today's visit shows atrial fibrillation with ventricular rate 74 bpm, nonspecific ST and T wave abnormality  Other past medical history  No TIA or stroke type symptoms. Tolerating aspirin  Previous significant evaluation by  cardiology for his atrial fibrillation. Previously seen at [facility] among other institutions as well as through our group, Dr. Caryl Comes many years ago. Atrial fibrillation dates back to 1999. He's been maintained on low-dose aspirin with no TIA or stroke symptoms.  In terms of his prior family history, father had coronary artery disease, bypass surgery at age 70, died at age 72 Mother had dementia, other relatives with stroke  Echocardiogram October 2014 was reviewed. This showed normal ejection fraction with moderate MR, mild to moderate TR, mild AI, mild aortic valve stenosis with possible bicuspid aortic valve, borderline mitral valve prolapse    Allergies  Allergen Reactions  . Penicillins    REACTION: Itching and hives  . Quinolones     Joint pain, disoriented    Outpatient Encounter Prescriptions as of 11/01/2014  Medication Sig  . aspirin 81 MG tablet Take 81 mg by mouth daily.    . cholecalciferol (VITAMIN D) 1000 UNITS tablet Take 2,000 Units by mouth daily.   Marland Kitchen co-enzyme Q-10 50 MG capsule Take 50 mg by mouth 2 (two) times daily.  . metoprolol tartrate (LOPRESSOR) 12.5 mg TABS Take 12.5 mg by mouth daily as needed.   . Multiple Vitamins-Minerals (MULTIVITAMIN WITH MINERALS) tablet Take 1 tablet by mouth daily.  . Red Yeast Rice 600 MG CAPS Take 600 mg by mouth 2 (two) times daily.   No facility-administered encounter medications on file as of 11/01/2014.    Past Medical History  Diagnosis Date  . BPH (benign prostatic hypertrophy)   . Bicuspid aortic valve   . Arrhythmia     chronic atrial fibrillation  . Atrial fibrillation     managed localy by [facility]  . Heart murmur     Past Surgical History  Procedure Laterality Date  . Tonsillectomy  8 YOA  . Tendon repair  10 YOA    Left little finger  . Meniscus repair  03/1971    Medial, left knee open  . Polypectomy  06/14/2001    Colon, internal hemorrhoids  . Upper gastrointestinal endoscopy  01/12/06  . Ett myoview  02/16/2006    Mod inferior defect, neg ischemia  . Doppler echocardiography  02/11/06    Nml  EF 60%  . Colonoscopy  2008    Social  History  reports that he quit smoking about 41 years ago. He has never used smokeless tobacco. He reports that he drinks alcohol. He reports that he does not use illicit drugs.  Family History family history includes Alzheimer's disease in his mother; Cancer in his brother; Depression in his brother and mother; Diabetes in his other; Heart disease in his paternal grandfather; Heart disease (age of onset: 53) in his father. There is no history of Hypertension, Alcohol abuse, or Drug abuse.   Review of Systems  Constitutional: Positive for fatigue.  HENT:  Negative.   Respiratory: Positive for shortness of breath.   Cardiovascular: Negative.   Gastrointestinal: Negative.   Musculoskeletal: Negative.   Neurological: Negative.   Hematological: Negative.   Psychiatric/Behavioral: Negative.   All other systems reviewed and are negative.   BP 100/72 mmHg  Pulse 71  Ht 5\' 10"  (1.778 m)  Wt 176 lb 12 oz (80.173 kg)  BMI 25.36 kg/m2  Physical Exam  Constitutional: He is oriented to person, place, and time. He appears well-developed and well-nourished.  HENT:  Head: Normocephalic.  Nose: Nose normal.  Mouth/Throat: Oropharynx is clear and moist.  Eyes: Conjunctivae are normal. Pupils are equal, round, and reactive to light.  Neck: Normal range of motion. Neck supple. No JVD present.  Cardiovascular: Normal rate, S1 normal, S2 normal, normal heart sounds and intact distal pulses.  An irregularly irregular rhythm present. Exam reveals no gallop and no friction rub.   No murmur heard. Pulmonary/Chest: Effort normal and breath sounds normal. No respiratory distress. He has no wheezes. He has no rales. He exhibits no tenderness.  Abdominal: Soft. Bowel sounds are normal. He exhibits no distension. There is no tenderness.  Musculoskeletal: Normal range of motion. He exhibits no edema or tenderness.  Lymphadenopathy:    He has no cervical adenopathy.  Neurological: He is alert and oriented to person, place, and time. Coordination normal.  Skin: Skin is warm and dry. No rash noted. No erythema.  Psychiatric: He has a normal mood and affect. His behavior is normal. Judgment and thought content normal.      Assessment and Plan   Nursing note and vitals reviewed.

## 2014-11-01 NOTE — Assessment & Plan Note (Signed)
Chronic atrial fibrillation. Rate relatively well-controlled. He is not taking metoprolol. Suggested he should consider taking low-dose metoprolol with exertion as this might help his symptoms of shortness of breath Echocardiogram pending given his  symptoms

## 2014-11-01 NOTE — Patient Instructions (Signed)
You are doing well. No medication changes were made.  We will order an echocardiogram for shortness of breath, atrial fibrillation  If your breathing gets worse, call the office  We could use digoxin for rate control or lasix/furosemide for fluid  Please call us if you have new issues that need to be addressed before your next appt.  Your physician wants you to follow-up in: 12 months.  You will receive a reminder letter in the mail two months in advance. If you don't receive a letter, please call our office to schedule the follow-up appointment.

## 2014-11-01 NOTE — Assessment & Plan Note (Signed)
Denies significant chest tightness, sometimes with mild shortness of breath Echocardiogram to rule out elevated right heart pressures

## 2014-11-07 ENCOUNTER — Other Ambulatory Visit: Payer: Self-pay | Admitting: Cardiovascular Disease

## 2014-11-26 ENCOUNTER — Other Ambulatory Visit: Payer: PRIVATE HEALTH INSURANCE

## 2015-01-24 ENCOUNTER — Ambulatory Visit (INDEPENDENT_AMBULATORY_CARE_PROVIDER_SITE_OTHER): Payer: PRIVATE HEALTH INSURANCE

## 2015-01-24 ENCOUNTER — Other Ambulatory Visit: Payer: Self-pay

## 2015-01-24 DIAGNOSIS — I4891 Unspecified atrial fibrillation: Secondary | ICD-10-CM

## 2015-03-03 ENCOUNTER — Encounter: Payer: Self-pay | Admitting: Internal Medicine

## 2015-03-03 ENCOUNTER — Ambulatory Visit (INDEPENDENT_AMBULATORY_CARE_PROVIDER_SITE_OTHER): Payer: PRIVATE HEALTH INSURANCE | Admitting: Internal Medicine

## 2015-03-03 VITALS — BP 110/78 | HR 67 | Temp 98.2°F | Wt 178.0 lb

## 2015-03-03 DIAGNOSIS — Q231 Congenital insufficiency of aortic valve: Secondary | ICD-10-CM | POA: Diagnosis not present

## 2015-03-03 DIAGNOSIS — G629 Polyneuropathy, unspecified: Secondary | ICD-10-CM | POA: Diagnosis not present

## 2015-03-03 DIAGNOSIS — I4891 Unspecified atrial fibrillation: Secondary | ICD-10-CM | POA: Diagnosis not present

## 2015-03-03 DIAGNOSIS — M24549 Contracture, unspecified hand: Secondary | ICD-10-CM | POA: Diagnosis not present

## 2015-03-03 DIAGNOSIS — Z0000 Encounter for general adult medical examination without abnormal findings: Secondary | ICD-10-CM | POA: Insufficient documentation

## 2015-03-03 LAB — LIPID PANEL
CHOLESTEROL: 195 mg/dL (ref 125–200)
HDL: 45 mg/dL (ref >=40)
LDL CALC: 127 mg/dL (ref <130)
TRIGLYCERIDES: 115 mg/dL (ref <150)
Total CHOL/HDL Ratio: 4.3 Ratio (ref <=5.0)
VLDL: 23 mg/dL (ref <30)

## 2015-03-03 LAB — T4, FREE: Free T4: 1.13 ng/dL (ref 0.80–1.80)

## 2015-03-03 LAB — COMPREHENSIVE METABOLIC PANEL
ALT: 26 U/L (ref 9–46)
AST: 28 U/L (ref 10–35)
Albumin: 4.4 g/dL (ref 3.6–5.1)
Alkaline Phosphatase: 52 U/L (ref 40–115)
BILIRUBIN TOTAL: 0.8 mg/dL (ref 0.2–1.2)
BUN: 17 mg/dL (ref 7–25)
CHLORIDE: 104 mmol/L (ref 98–110)
CO2: 28 mmol/L (ref 20–31)
CREATININE: 0.93 mg/dL (ref 0.70–1.25)
Calcium: 10.4 mg/dL — ABNORMAL HIGH (ref 8.6–10.3)
Glucose, Bld: 87 mg/dL (ref 65–99)
Potassium: 4.1 mmol/L (ref 3.5–5.3)
SODIUM: 137 mmol/L (ref 135–146)
TOTAL PROTEIN: 6.7 g/dL (ref 6.1–8.1)

## 2015-03-03 LAB — CBC WITH DIFFERENTIAL/PLATELET
BASOS ABS: 0 10*3/uL (ref 0.0–0.1)
BASOS PCT: 0 % (ref 0–1)
EOS ABS: 0.1 10*3/uL (ref 0.0–0.7)
EOS PCT: 2 % (ref 0–5)
HCT: 44.2 % (ref 39.0–52.0)
Hemoglobin: 15 g/dL (ref 13.0–17.0)
LYMPHS ABS: 2 10*3/uL (ref 0.7–4.0)
LYMPHS PCT: 31 % (ref 12–46)
MCH: 31.1 pg (ref 26.0–34.0)
MCHC: 33.9 g/dL (ref 30.0–36.0)
MCV: 91.5 fL (ref 78.0–100.0)
MONO ABS: 0.6 10*3/uL (ref 0.1–1.0)
MPV: 9 fL (ref 8.6–12.4)
Monocytes Relative: 9 % (ref 3–12)
Neutro Abs: 3.8 10*3/uL (ref 1.7–7.7)
Neutrophils Relative %: 58 % (ref 43–77)
PLATELETS: 151 10*3/uL (ref 150–400)
RBC: 4.83 MIL/uL (ref 4.22–5.81)
RDW: 14.2 % (ref 11.5–15.5)
WBC: 6.6 10*3/uL (ref 4.0–10.5)

## 2015-03-03 LAB — VITAMIN B12: VITAMIN B 12: 325 pg/mL (ref 211–911)

## 2015-03-03 NOTE — Assessment & Plan Note (Signed)
Will get evaluation by hand surgeon

## 2015-03-03 NOTE — Progress Notes (Signed)
Pre visit review using our clinic review tool, if applicable. No additional management support is needed unless otherwise documented below in the visit note. 

## 2015-03-03 NOTE — Progress Notes (Signed)
Subjective:    Patient ID: RACE HIDER, male    DOB: 1950-06-15, 65 y.o.   MRN: [id number]  HPI Here to establish care--- transfer care  Atrial fib for 18 years No symptoms lately --now that he is permanent (did feel went jumped back and forth) Looked into ablation--decided not to Has affected his aerobic capacity-but still tries to exercise regularly Had taken 2-3 months off--but now back since the new year No chest pain No true dizziness but feels his balance is not as good--notices more outside (in bright sunshine)  Feet feel funny Sensory changes like pins and needles---fairly constant  BPH not a big deal Gets PSA screening at [facility] at [facility]--recent one 0.2 or 0.3 No nocturia  Current Outpatient Prescriptions on File Prior to Visit  Medication Sig Dispense Refill  . aspirin 81 MG tablet Take 81 mg by mouth daily.      . cholecalciferol (VITAMIN D) 1000 UNITS tablet Take 2,000 Units by mouth daily.     Marland Kitchen co-enzyme Q-10 50 MG capsule Take 50 mg by mouth 2 (two) times daily.    . metoprolol tartrate (LOPRESSOR) 25 MG tablet TAKE 1/2 TABLET ONCE A DAY 15 tablet 3  . Red Yeast Rice 600 MG CAPS Take 600 mg by mouth 2 (two) times daily.     No current facility-administered medications on file prior to visit.    Allergies  Allergen Reactions  . Moxifloxacin Anxiety and Other (See Comments)    Other Reaction: mms cramping, insomnia REACTION: Joint pain/disoriented  . Penicillins     REACTION: Itching and hives  . Quinolones     Joint pain, disoriented    Past Medical History  Diagnosis Date  . BPH (benign prostatic hypertrophy)   . Bicuspid aortic valve   . Atrial fibrillation ([place])        . Heart murmur     Past Surgical History  Procedure Laterality Date  . Tonsillectomy  8 YOA  . Tendon repair  10 YOA    Left little finger  . Meniscus repair  03/1971    Medial, left knee open  . Polypectomy  06/14/2001    Colon, internal hemorrhoids  . Upper  gastrointestinal endoscopy  01/12/06  . Ett myoview  02/16/2006    Mod inferior defect, neg ischemia  . Doppler echocardiography  02/11/06    Nml  EF 60%  . Colonoscopy  2008    Family History  Problem Relation Age of Onset  . Alzheimer's disease Mother   . Depression Mother   . Heart disease Father 35    S/P heart attack with bypass graft, SOB, retired Agricultural consultant in [place] with no significant chest sx, has had generalized pruritis  . Cancer Brother     bladder CA  . Heart disease Paternal Grandfather     MI  . Hypertension Neg Hx   . Alcohol abuse Neg Hx   . Drug abuse Neg Hx   . Depression Brother   . Diabetes Other     Social History   Social History  . Marital Status: Married    Spouse Name: N/A  . Number of Children: 3  . Years of Education: N/A   Occupational History  . 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 personal trainer     Now retired   Social History Main Topics  . Smoking status: Former Smoker    Quit date: 10/07/1973  . Smokeless tobacco: Never Used  .  Alcohol Use: Yes     Comment: wine daily, social drinker  . Drug Use: No  . Sexual Activity: Not on file   Other Topics Concern  . Not on file   Social History Narrative   Review of Systems Gained a little--but coming back down again with back to gym and cutting some carbs Has chronic nasal drip--- uses saline spray. flonase no help Sleeps in recliner often--better than in bed often No tinnitus lately Hearing okay Ongoing contracture in 5th fingers ---ready to do something. Left is totally contracted--wants to work on the other fingers     Objective:   Physical Exam  Constitutional: He is oriented to person, place, and time. He appears well-developed and well-nourished. No distress.  HENT:  Mouth/Throat: Oropharynx is clear and moist. No oropharyngeal exudate.  Neck: Normal range of motion. Neck supple. No thyromegaly present.  Cardiovascular: Normal rate and intact distal pulses.   Slightly  irregular Soft aortic systolic murmur to right carotid  Pulmonary/Chest: Effort normal and breath sounds normal. No respiratory distress. He has no wheezes. He has no rales.  Abdominal: Soft. There is no tenderness.  Musculoskeletal: He exhibits no edema.  Nodule along the middle of left arch Contractures of several fingers--- 5th are worst  Lymphadenopathy:    He has no cervical adenopathy.  Neurological: He is alert and oriented to person, place, and time.  Skin: No rash noted. No erythema.  Psychiatric: He has a normal mood and affect. His behavior is normal.          Assessment & Plan:

## 2015-03-03 NOTE — Assessment & Plan Note (Signed)
Rate is controlled ASA only

## 2015-03-03 NOTE — Assessment & Plan Note (Signed)
No symptoms 

## 2015-03-03 NOTE — Assessment & Plan Note (Signed)
Mostly tingling and change in temperature sensation in feet Will just check labs

## 2015-03-03 NOTE — Assessment & Plan Note (Signed)
Doesn't want any immunizations--even Td Recent PSA Colon due later this year

## 2015-03-05 LAB — PROTEIN ELECTROPHORESIS, SERUM, WITH REFLEX
ALBUMIN ELP: 4.4 g/dL (ref 3.8–4.8)
ALPHA-1-GLOBULIN: 0.2 g/dL (ref 0.2–0.3)
Alpha-2-Globulin: 0.5 g/dL (ref 0.5–0.9)
BETA 2: 0.3 g/dL (ref 0.2–0.5)
Beta Globulin: 0.4 g/dL (ref 0.4–0.6)
Gamma Globulin: 1 g/dL (ref 0.8–1.7)
TOTAL PROTEIN, SERUM ELECTROPHOR: 6.7 g/dL (ref 6.1–8.1)

## 2015-03-07 ENCOUNTER — Encounter: Payer: Self-pay | Admitting: Internal Medicine

## 2015-04-12 ENCOUNTER — Encounter: Payer: Self-pay | Admitting: Internal Medicine

## 2015-05-21 ENCOUNTER — Ambulatory Visit (INDEPENDENT_AMBULATORY_CARE_PROVIDER_SITE_OTHER): Payer: PRIVATE HEALTH INSURANCE | Admitting: Internal Medicine

## 2015-05-21 ENCOUNTER — Ambulatory Visit: Payer: PRIVATE HEALTH INSURANCE | Admitting: Internal Medicine

## 2015-05-21 ENCOUNTER — Encounter: Payer: Self-pay | Admitting: Internal Medicine

## 2015-05-21 VITALS — BP 110/70 | HR 96 | Temp 98.3°F | Wt 175.0 lb

## 2015-05-21 DIAGNOSIS — J209 Acute bronchitis, unspecified: Secondary | ICD-10-CM | POA: Diagnosis not present

## 2015-05-21 MED ORDER — DOXYCYCLINE HYCLATE 100 MG PO TABS: 100.0000 mg | ORAL_TABLET | Freq: Two times a day (BID) | ORAL | Status: DC

## 2015-05-21 NOTE — Progress Notes (Signed)
Subjective:    Patient ID: Don Warner, male    DOB: 1950/11/20, 65 y.o.   MRN: [id number]  HPI Here due to respiratory illness  Started almost a month ago--as head cold Different than usual for him Seemed better after 2.5 weeks--then developed cough that won't stop Seems some better in past day or so guafenisin may be helping some  Mostly dry cough Seems more in throat--not chest Some head congestion--but not as bad No fever No chills or sweats No SOB---other than standard DOE with atrial fib  No other meds  Current Outpatient Prescriptions on File Prior to Visit  Medication Sig Dispense Refill  . aspirin 81 MG tablet Take 81 mg by mouth daily.      . cholecalciferol (VITAMIN D) 1000 UNITS tablet Take 2,000 Units by mouth daily.     Marland Kitchen co-enzyme Q-10 50 MG capsule Take 50 mg by mouth 2 (two) times daily.    . metoprolol tartrate (LOPRESSOR) 25 MG tablet TAKE 1/2 TABLET ONCE A DAY 15 tablet 3  . Red Yeast Rice 600 MG CAPS Take 600 mg by mouth 2 (two) times daily.     No current facility-administered medications on file prior to visit.    Allergies  Allergen Reactions  . Moxifloxacin Anxiety and Other (See Comments)    Other Reaction: mms cramping, insomnia REACTION: Joint pain/disoriented  . Penicillins     REACTION: Itching and hives  . Quinolones     Joint pain, disoriented    Past Medical History  Diagnosis Date  . BPH (benign prostatic hypertrophy)   . Bicuspid aortic valve   . Atrial fibrillation ([place])        . Heart murmur     Past Surgical History  Procedure Laterality Date  . Tonsillectomy  8 YOA  . Tendon repair  10 YOA    Left little finger  . Meniscus repair  03/1971    Medial, left knee open  . Polypectomy  06/14/2001    Colon, internal hemorrhoids  . Upper gastrointestinal endoscopy  01/12/06  . Ett myoview  02/16/2006    Mod inferior defect, neg ischemia  . Doppler echocardiography  02/11/06    Nml  EF 60%  . Colonoscopy  2008     Family History  Problem Relation Age of Onset  . Alzheimer's disease Mother   . Depression Mother   . 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 Cancer Brother     bladder CA  . Heart disease Paternal Grandfather     MI  . Hypertension Neg Hx   . Alcohol abuse Neg Hx   . Drug abuse Neg Hx   . Depression Brother   . Diabetes Other     Social History   Social History  . Marital Status: Married    Spouse Name: N/A  . Number of Children: 3  . Years of Education: N/A   Occupational History  . 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 Smoking status: Former Smoker    Quit date: 10/07/1973  . Smokeless tobacco: Never Used  . Alcohol Use: Yes     Comment: wine daily, social drinker  . Drug Use: No  . Sexual Activity: Not on file   Other Topics  Concern  . Not on file   Social History Narrative   Review of Systems Feels slightly unsteady on his feet--like if he turns his head. No true vertigo (but has had before) No rash No vomiting or diarrhea Appetite is okay    Objective:   Physical Exam  Constitutional: He appears well-developed and well-nourished. No distress.  HENT:  No sinus tenderness Mild nasal inflammation TMs normal Slight pharyngeal injection  Neck: Normal range of motion. Neck supple. No thyromegaly present.  Pulmonary/Chest: Effort normal and breath sounds normal. No respiratory distress. He has no wheezes. He has no rales.  Lymphadenopathy:    He has no cervical adenopathy.          Assessment & Plan:

## 2015-05-21 NOTE — Assessment & Plan Note (Signed)
Going on over 3 weeks ?atypical  Might be improving Will continue supportive care If worsens, will start doxy

## 2015-05-21 NOTE — Patient Instructions (Signed)
Please start the antibiotic if you are worsening in the next few days. 

## 2015-05-21 NOTE — Progress Notes (Signed)
Pre visit review using our clinic review tool, if applicable. No additional management support is needed unless otherwise documented below in the visit note. 

## 2015-05-24 ENCOUNTER — Other Ambulatory Visit: Payer: Self-pay | Admitting: Cardiovascular Disease

## 2015-05-30 ENCOUNTER — Telehealth: Payer: Self-pay | Admitting: Cardiovascular Disease

## 2015-05-30 NOTE — Telephone Encounter (Signed)
Pt calling stating his Afib is acting up again Once in a while its acts up a bit Pt is thinking about doing a procedure  Not sure if he would like to do it  Would like our opinion  Pt has Duponse disease  There's a procedure that involves a drug "Diaflex" Potentially can cause anaphylactic shock Dr Astrid Divine at [facility] would like to do this with patient. He is an ortho doctor there.  Please advise  We can respond in MyChart

## 2015-05-30 NOTE — Telephone Encounter (Signed)
Left message for pt to call back  °

## 2015-06-11 ENCOUNTER — Ambulatory Visit (INDEPENDENT_AMBULATORY_CARE_PROVIDER_SITE_OTHER): Payer: PRIVATE HEALTH INSURANCE | Admitting: Internal Medicine

## 2015-06-11 ENCOUNTER — Encounter: Payer: Self-pay | Admitting: Internal Medicine

## 2015-06-11 VITALS — BP 106/82 | HR 73 | Temp 98.2°F | Wt 172.5 lb

## 2015-06-11 DIAGNOSIS — R2689 Other abnormalities of gait and mobility: Secondary | ICD-10-CM | POA: Insufficient documentation

## 2015-06-11 DIAGNOSIS — R29818 Other symptoms and signs involving the nervous system: Secondary | ICD-10-CM | POA: Diagnosis not present

## 2015-06-11 NOTE — Assessment & Plan Note (Signed)
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 Will just check brain MRI

## 2015-06-11 NOTE — Progress Notes (Signed)
Pre visit review using our clinic review tool, if applicable. No additional management support is needed unless otherwise documented below in the visit note. 

## 2015-06-11 NOTE — Progress Notes (Signed)
Subjective:    Patient ID: Don Warner, male    DOB: 30-Jan-1951, 65 y.o.   MRN: [id number]  HPI Here due to dizziness Hard to describe the funny feeling  Occasional loss of balance Fuzzy in head May worsen if he turns head fast---but occasionally without any provocation Has had sense of "you don't know where you are..."--- occurred 15 years ago and 6 months ago. Only 10-15 seconds  Never has the balance issues if sitting or driving Does notice if stands quickly Not close to falling but may slip to the side a little Still has the sensory changes in feet--seems some better. Vitamin B12 wasn't tolerated  Able to think and reason okay No change in doing arithmetic, etc Notes that he was very involved in martial arts---wonders if he has post traumatic changes (but was more to the spine and neck and not head)  Current Outpatient Prescriptions on File Prior to Visit  Medication Sig Dispense Refill  . aspirin 81 MG tablet Take 81 mg by mouth daily.      . cholecalciferol (VITAMIN D) 1000 UNITS tablet Take 2,000 Units by mouth daily.     Marland Kitchen co-enzyme Q-10 50 MG capsule Take 100 mg by mouth daily.     . metoprolol tartrate (LOPRESSOR) 25 MG tablet TAKE 1/2 TABLET ONCE A DAY 15 tablet 3  . Red Yeast Rice 600 MG CAPS Take 600 mg by mouth 2 (two) times daily.     No current facility-administered medications on file prior to visit.    Allergies  Allergen Reactions  . Moxifloxacin Anxiety and Other (See Comments)    Other Reaction: mms cramping, insomnia REACTION: Joint pain/disoriented  . Penicillins     REACTION: Itching and hives  . Quinolones     Joint pain, disoriented    Past Medical History  Diagnosis Date  . BPH (benign prostatic hypertrophy)   . Bicuspid aortic valve   . Atrial fibrillation ([place])        . Heart murmur     Past Surgical History  Procedure Laterality Date  . Tonsillectomy  8 YOA  . Tendon repair  10 YOA    Left little finger  . Meniscus repair   03/1971    Medial, left knee open  . Polypectomy  06/14/2001    Colon, internal hemorrhoids  . Upper gastrointestinal endoscopy  01/12/06  . Ett myoview  02/16/2006    Mod inferior defect, neg ischemia  . Doppler echocardiography  02/11/06    Nml  EF 60%  . Colonoscopy  2008    Family History  Problem Relation Age of Onset  . Alzheimer's disease Mother   . Depression Mother   . 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 Cancer Brother     bladder CA  . Heart disease Paternal Grandfather     MI  . Hypertension Neg Hx   . Alcohol abuse Neg Hx   . Drug abuse Neg Hx   . Depression Brother   . Diabetes Other     Social History   Social History  . Marital Status: Married    Spouse Name: N/A  . Number of Children: 3  . Years of Education: N/A   Occupational History  . 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 Smoking status: Former Smoker    Quit date: 10/07/1973  . Smokeless tobacco: Never Used  . Alcohol Use: Yes     Comment: wine daily, social drinker  . Drug Use: No  . Sexual Activity: Not on file   Other Topics Concern  . Not on file   Social History Narrative   Review of Systems  Sleep is still not good A fib awakens him Some fatigue in day More consistent with the metoprolol No urinary incontinence     Objective:   Physical Exam  Constitutional: He is oriented to person, place, and time. He appears well-developed and well-nourished. No distress.  HENT:  Mouth/Throat: Oropharynx is clear and moist. No oropharyngeal exudate.  Eyes: EOM are normal.  No nystagmus  Neck: Normal range of motion. Neck supple.  Cardiovascular:  Irregular Soft aortic systolic murmur  Pulmonary/Chest: Effort normal and breath sounds normal. No respiratory distress. He has no wheezes. He has no rales.    Musculoskeletal: He exhibits no edema.  Lymphadenopathy:    He has no cervical adenopathy.  Neurological: He is alert and oriented to person, place, and time. He has normal strength. He displays no atrophy and no tremor. No cranial nerve deficit. He exhibits normal muscle tone. He displays a negative Romberg sign. Coordination and gait normal.          Assessment & Plan:

## 2015-07-01 ENCOUNTER — Ambulatory Visit
Admission: RE | Admit: 2015-07-01 | Discharge: 2015-07-01 | Disposition: A | Discharge status: Home / Self Care | Payer: PRIVATE HEALTH INSURANCE | Source: Ambulatory Visit | Attending: Internal Medicine | Admitting: Internal Medicine

## 2015-07-01 DIAGNOSIS — R29818 Other symptoms and signs involving the nervous system: Secondary | ICD-10-CM | POA: Insufficient documentation

## 2015-07-01 DIAGNOSIS — R2689 Other abnormalities of gait and mobility: Secondary | ICD-10-CM

## 2015-08-25 ENCOUNTER — Encounter: Payer: Self-pay | Admitting: Cardiovascular Disease

## 2015-08-25 ENCOUNTER — Ambulatory Visit (INDEPENDENT_AMBULATORY_CARE_PROVIDER_SITE_OTHER): Payer: PRIVATE HEALTH INSURANCE | Admitting: Cardiovascular Disease

## 2015-08-25 VITALS — BP 100/80 | HR 62 | Ht 70.0 in | Wt 174.2 lb

## 2015-08-25 DIAGNOSIS — I482 Chronic atrial fibrillation: Secondary | ICD-10-CM

## 2015-08-25 DIAGNOSIS — Q231 Congenital insufficiency of aortic valve: Secondary | ICD-10-CM

## 2015-08-25 DIAGNOSIS — R5382 Chronic fatigue, unspecified: Secondary | ICD-10-CM

## 2015-08-25 DIAGNOSIS — E785 Hyperlipidemia, unspecified: Secondary | ICD-10-CM | POA: Diagnosis not present

## 2015-08-25 DIAGNOSIS — R5381 Other malaise: Secondary | ICD-10-CM

## 2015-08-25 DIAGNOSIS — I4821 Permanent atrial fibrillation: Secondary | ICD-10-CM

## 2015-08-25 NOTE — Patient Instructions (Addendum)
Medication Instructions:   No medications changes  Labwork:  No new labs  Testing/Procedures:  No new testing  Research a CT coronary calcium score to look for coronary blockage   Follow-Up: It was a pleasure seeing you in the office today. Please call us if you have new issues that need to be addressed before your next appt.  [phone]  Your physician wants you to follow-up in: 12 months.  You will receive a reminder letter in the mail two months in advance. If you don't receive a letter, please call our office to schedule the follow-up appointment.  If you need a refill on your cardiac medications before your next appointment, please call your pharmacy.

## 2015-08-25 NOTE — Progress Notes (Signed)
Cardiology Office Note  Date:  08/25/2015   ID:  SHELL POPOVICH, DOB Aug 28, 1950, MRN [id number]  PCP:  Viviana Simpler, MD   Chief Complaint  Patient presents with  . Other    Afib. Meds reviewed verbally with pt.    HPI:  Mr. [name] is a pleasant 65 year old gentleman with long history of permanent atrial fibrillation, mitral valve prolapse, mitral valve regurgitation, suspected bicuspid aortic valve with mild stenosis, hyperlipidemia who presents for routine follow-up of his atrial fibrillation. patient of Dr. Derrel Nip Prior echogram suggesting moderate MR (read by Dr. Clayborn Bigness in 2014)  In follow-up today, he reports that he feels stable, He does feel "pissed off with afib",  does not feel that he can exercise as he used to when he was younger Walks 4 days a week, 30 min No chest pain  Echo 01/2015: mild tio moderate AS, bicuspid?  Total chol 195, ldl 127 Tolerating low-dose metoprolol  EKG on today's visit shows atrial fibrillation with ventricular rate 62 bpm, T-wave abnormality in V4 through V6, 1 and aVL  Other past medical history  No TIA or stroke type symptoms. Tolerating aspirin  Previous significant evaluation by  cardiology for his atrial fibrillation. Previously seen at [facility] among other institutions as well as through our group, Dr. Caryl Comes many years ago. Atrial fibrillation dates back to 1999. He's been maintained on low-dose aspirin with no TIA or stroke symptoms.  In terms of his prior family history, father had coronary artery disease, bypass surgery at age 30, died at age 57 Mother had dementia, other relatives with stroke  Echocardiogram October 2014 was reviewed. This showed normal ejection fraction with moderate MR, mild to moderate TR, mild AI, mild aortic valve stenosis with possible bicuspid aortic valve, borderline mitral valve prolapse   PMH:   has a past medical history of Atrial fibrillation ([place]); Bicuspid aortic valve; BPH (benign  prostatic hypertrophy); and Heart murmur.  PSH:    Past Surgical History:  Procedure Laterality Date  . COLONOSCOPY  2008  . DOPPLER ECHOCARDIOGRAPHY  02/11/06   Nml  EF 60%  . ETT Myoview  02/16/2006   Mod inferior defect, neg ischemia  . MENISCUS REPAIR  03/1971   Medial, left knee open  . POLYPECTOMY  06/14/2001   Colon, internal hemorrhoids  . TENDON REPAIR  10 YOA   Left little finger  . TONSILLECTOMY  8 YOA  . UPPER GASTROINTESTINAL ENDOSCOPY  01/12/06    Current Outpatient Prescriptions  Medication Sig Dispense Refill  . aspirin 81 MG tablet Take 81 mg by mouth daily.      . cholecalciferol (VITAMIN D) 1000 UNITS tablet Take 2,000 Units by mouth daily.     . Coenzyme Q10 (CO Q 10) 100 MG CAPS Take by mouth daily.    . metoprolol tartrate (LOPRESSOR) 25 MG tablet TAKE 1/2 TABLET ONCE A DAY (Patient taking differently: TAKE 1/2 TABLET DAILY PRN.) 15 tablet 3  . Red Yeast Rice 600 MG CAPS Take 600 mg by mouth 2 (two) times daily.     No current facility-administered medications for this visit.      Allergies:   Moxifloxacin; Penicillins; and Quinolones   Social History:  The patient  reports that he quit smoking about 41 years ago. He has never used smokeless tobacco. He reports that he drinks alcohol. He reports that he does not use drugs.   Family History:   family history includes Alzheimer's disease in his mother; Cancer in his  brother; Depression in his brother and mother; Diabetes in his other; Heart disease in his paternal grandfather; Heart disease (age of onset: 91) in his father.    Review of Systems: Review of Systems  Constitutional: Negative.   Respiratory: Negative.   Cardiovascular: Positive for palpitations.  Gastrointestinal: Negative.   Musculoskeletal: Negative.   Neurological: Negative.   Psychiatric/Behavioral: Negative.   All other systems reviewed and are negative.    PHYSICAL EXAM: VS:  BP 100/80 (BP Location: Left Arm, Patient  Position: Sitting, Cuff Size: Normal)   Pulse 62   Ht 5\' 10"  (1.778 m)   Wt 174 lb 4 oz (79 kg)   BMI 25.00 kg/m  , BMI Body mass index is 25 kg/m. GEN: Well nourished, well developed, in no acute distress  HEENT: normal  Neck: no JVD, carotid bruits, or masses Cardiac: Irregularly irregular,  no murmurs, rubs, or gallops,no edema  Respiratory:  clear to auscultation bilaterally, normal work of breathing GI: soft, nontender, nondistended, + BS MS: no deformity or atrophy  Skin: warm and dry, no rash Neuro:  Strength and sensation are intact Psych: euthymic mood, full affect    Recent Labs: 03/03/2015: ALT 26; BUN 17; Creat 0.93; Hemoglobin 15.0; Platelets 151; Potassium 4.1; Sodium 137    Lipid Panel Lab Results  Component Value Date   CHOL 195 03/03/2015   HDL 45 03/03/2015   LDLCALC 127 03/03/2015   TRIG 115 03/03/2015      Wt Readings from Last 3 Encounters:  08/25/15 174 lb 4 oz (79 kg)  06/11/15 172 lb 8 oz (78.2 kg)  05/21/15 175 lb (79.4 kg)       ASSESSMENT AND PLAN:  Permanent atrial fibrillation (HCC) - Plan: EKG 12-Lead Heart rate relatively well controlled, tolerating anticoagulation, no changes to his medications  Hyperlipemia Currently not on a statin Total cholesterol 190s Recommended exercise  Chronic fatigue and malaise He does do exercise several days per week Stable symptoms  Bicuspid aortic valve Mild to moderate aortic valve stenosis on prior echocardiogram Monitor for now  Long discussion concerning his exercise tolerance, effective atrial fibrillation Various treatment options  Total encounter time more than 25 minutes  Greater than 50% was spent in counseling and coordination of care with the patient   Disposition:   F/U  6 months   Orders Placed This Encounter  Procedures  . EKG 12-Lead     Signed, Esmond Plants, M.D., Ph.D. 08/25/2015  [place], [place]

## 2015-09-04 ENCOUNTER — Encounter: Payer: Self-pay | Admitting: Cardiovascular Disease

## 2015-09-05 ENCOUNTER — Other Ambulatory Visit: Payer: Self-pay

## 2015-09-05 DIAGNOSIS — Z8249 Family history of ischemic heart disease and other diseases of the circulatory system: Secondary | ICD-10-CM

## 2015-09-11 ENCOUNTER — Ambulatory Visit (INDEPENDENT_AMBULATORY_CARE_PROVIDER_SITE_OTHER)
Admission: RE | Admit: 2015-09-11 | Discharge: 2015-09-11 | Disposition: A | Discharge status: Home / Self Care | Payer: Self-pay | Source: Ambulatory Visit | Attending: Cardiovascular Disease | Admitting: Cardiovascular Disease

## 2015-09-11 DIAGNOSIS — Z8249 Family history of ischemic heart disease and other diseases of the circulatory system: Secondary | ICD-10-CM

## 2015-09-17 ENCOUNTER — Telehealth: Payer: Self-pay | Admitting: Cardiovascular Disease

## 2015-09-17 NOTE — Telephone Encounter (Signed)
Pt has some questions regarding his calcium CT results. Please call. States he will be at the home contact # until 66, after that may reach on cell #.

## 2015-09-17 NOTE — Telephone Encounter (Signed)
Patient called back in after reviewing results that were released in mychart. He quoted comment in report "There are extensive calcifications of the aortic valve." He was concerned about this and states that if someone took time to report this then he is concerned. He reports that he has had a decrease in his aerobic capacity and has been symptomatic within short period of time in the course of a year. Let him know that Dr. Rockey Situ had reviewed results, images, and reviewed his comments related to that test. Reviewed previous echocardiogram and CT calcium score with him in detail. He requested to see or speak with Dr. Rockey Situ to review this report and his symptoms. Scheduled him for 11/05/15 at 08:20 am with Dr. Rockey Situ. Let him know that I would forward this to him and to call back if we can be of any further assistance. He verbalized understanding of our conversation, agreement with plan of care, and had no further questions at this time.

## 2015-09-17 NOTE — Telephone Encounter (Signed)
Mild aortic valve stenosis (this means calcification) seen on echo in 2014, also 2016 Mild to moderate stenosis 2016 echo (this will look like calcium) Possible bicuspid aortic valve Typically should not cause the sx he is having until it gets worse,  At worst,  mild SOB Not severe enough for surgery  at this time. May take many more years for it to become severe

## 2015-09-18 NOTE — Telephone Encounter (Signed)
Reviewed with patient in detail Dr. Gwenyth Ober note regarding his results. He verbalized understanding of our conversation and had no further questions at this time. Confirmed his appointment on 11/05/15 at 08:20 AM.

## 2015-09-22 ENCOUNTER — Other Ambulatory Visit: Payer: Self-pay | Admitting: Cardiovascular Disease

## 2015-09-22 ENCOUNTER — Ambulatory Visit: Payer: Self-pay | Admitting: General Surgery

## 2015-09-24 ENCOUNTER — Ambulatory Visit: Payer: Self-pay | Admitting: General Surgery

## 2015-10-05 IMAGING — CR DG CHEST 2V
2 series · 2 of 2 positions shown · non-contrast
Comparison: None.

CLINICAL DATA: Remote history of tobacco abuse

EXAM:
CHEST  2 VIEW

[view not recorded (1 of 2)]
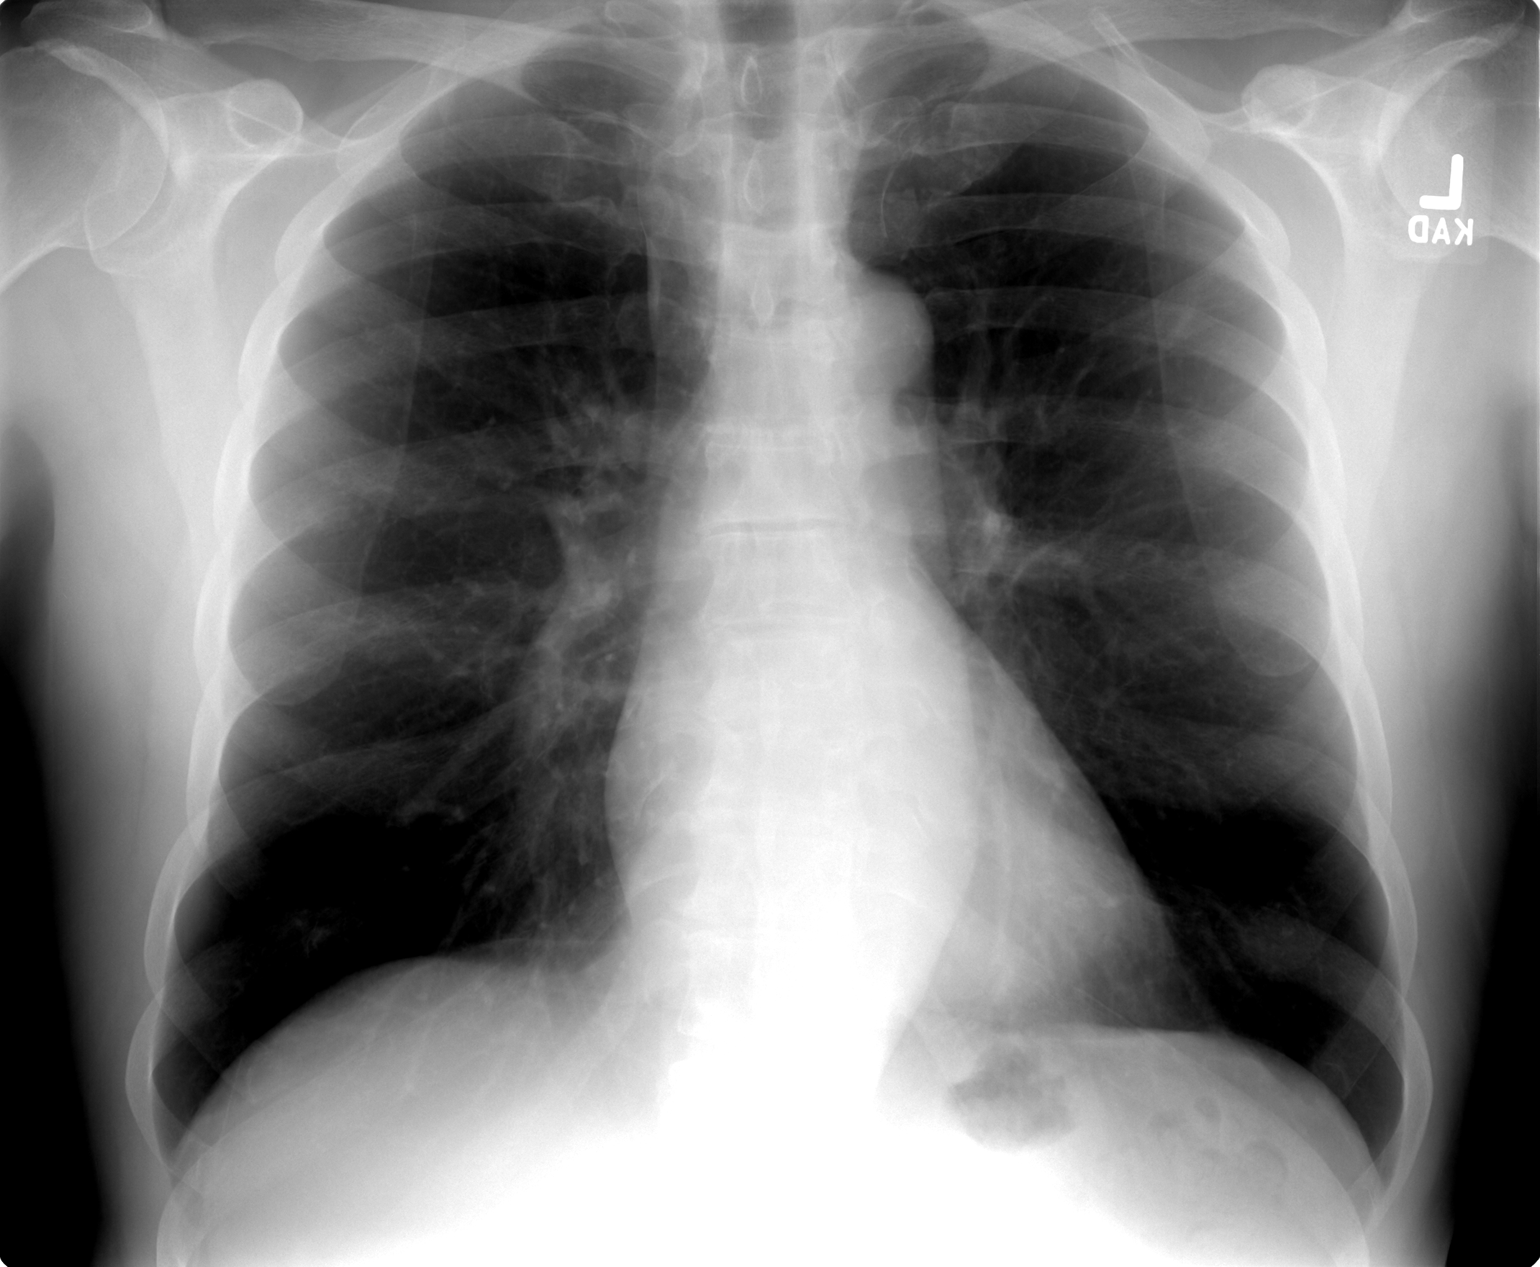

[view not recorded (2 of 2)]
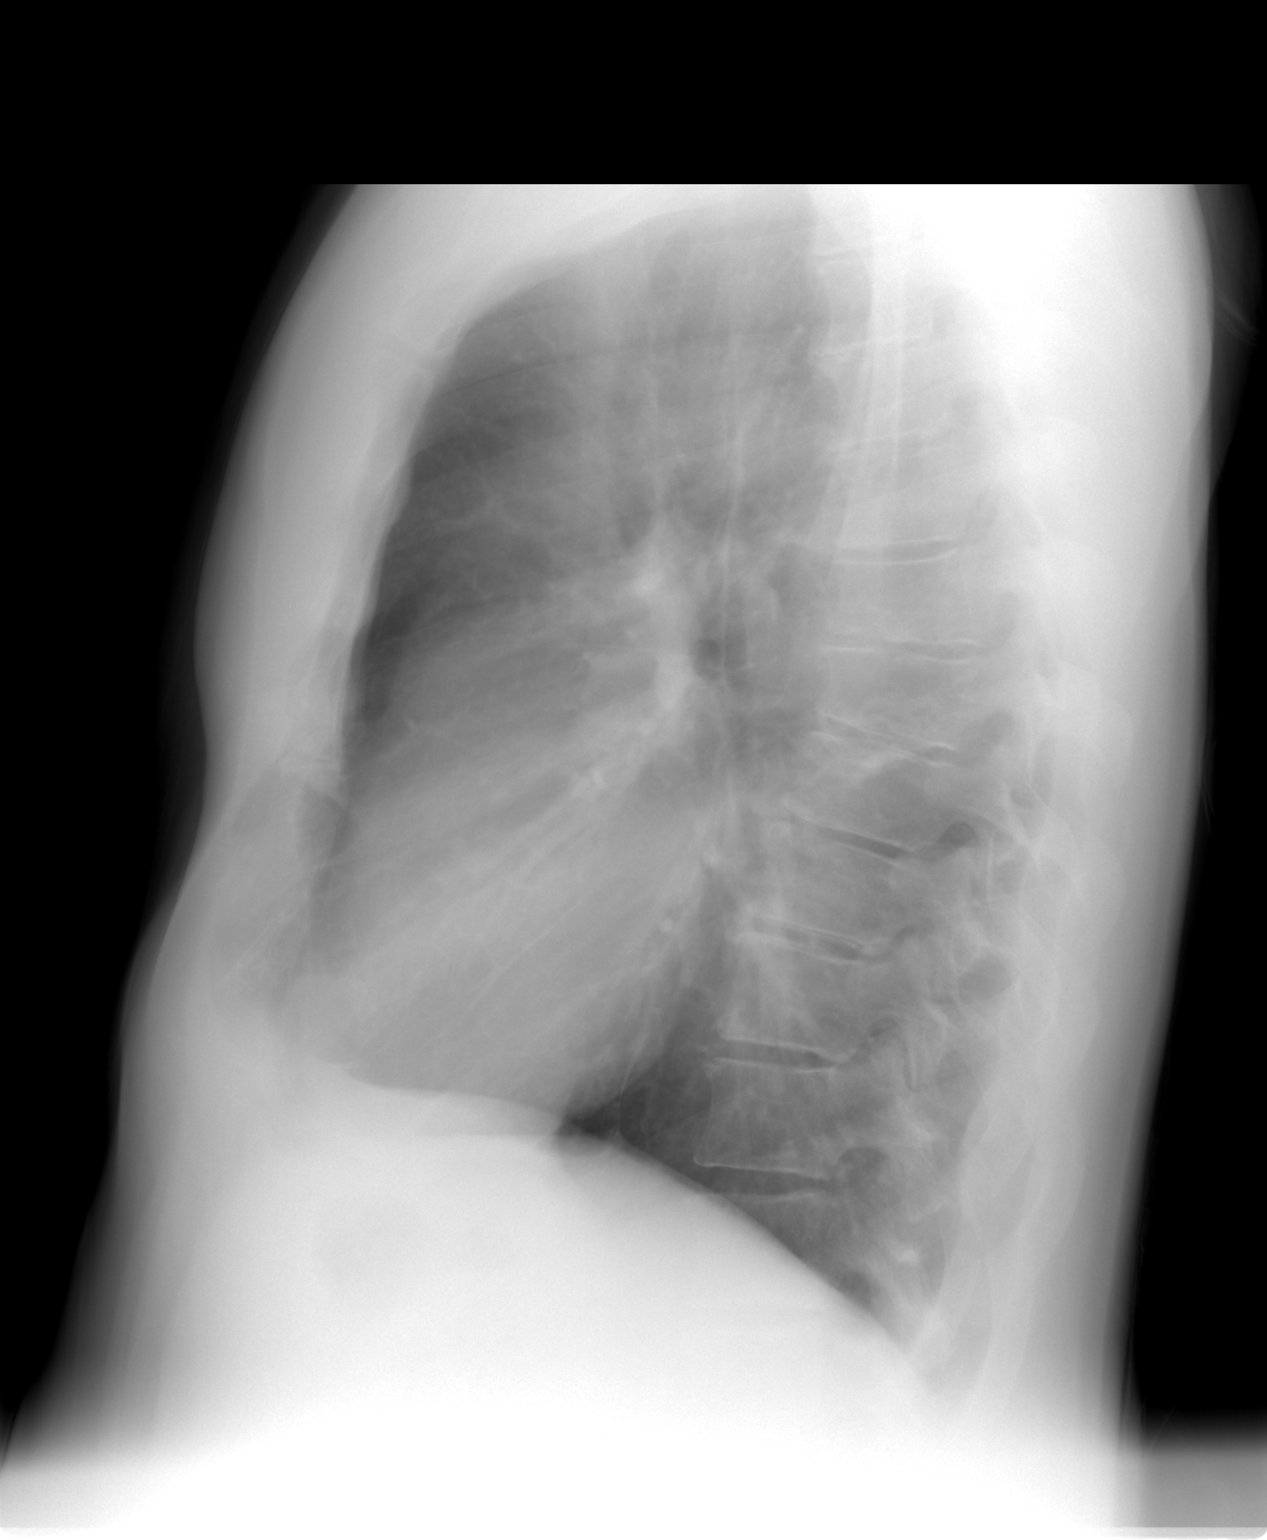

[2 of 2 positions shown; findings below may reference images not displayed]

FINDINGS: Cardiomediastinal silhouette is unremarkable. No acute infiltrate or
pleural effusion. No pulmonary edema. Mild degenerative changes
thoracic spine. Minimal compression deformities mid thoracic spine
of indeterminate age.
IMPRESSION: No acute infiltrate or pulmonary edema. Minimal compression
deformities mid thoracic spine of indeterminate age. Clinical
correlation is necessary.

## 2015-11-05 ENCOUNTER — Ambulatory Visit (INDEPENDENT_AMBULATORY_CARE_PROVIDER_SITE_OTHER): Payer: PRIVATE HEALTH INSURANCE | Admitting: Cardiovascular Disease

## 2015-11-05 ENCOUNTER — Encounter: Payer: Self-pay | Admitting: Cardiovascular Disease

## 2015-11-05 VITALS — BP 110/80 | HR 66 | Ht 70.0 in | Wt 172.5 lb

## 2015-11-05 DIAGNOSIS — Q231 Congenital insufficiency of aortic valve: Secondary | ICD-10-CM

## 2015-11-05 DIAGNOSIS — I4891 Unspecified atrial fibrillation: Secondary | ICD-10-CM | POA: Diagnosis not present

## 2015-11-05 NOTE — Patient Instructions (Signed)

## 2015-11-05 NOTE — Progress Notes (Signed)
Cardiology Office Note  Date:  11/05/2015   ID:  Don Warner, DOB 20-Feb-1950, MRN [id number]  PCP:  Viviana Simpler, MD   Chief Complaint  Patient presents with  . other    Follow A-Fib and CT calcium score. Meds reviewed by the pt. verbally. "doing well."     HPI:  [name] is a pleasant 65 year old gentleman with long history of permanent atrial fibrillation, mitral valve prolapse, mild mitral valve regurgitation, suspected bicuspid aortic valve with mild to moderate stenosis, hyperlipidemia who presents for routine follow-up of his atrial fibrillation and aortic valve stenosis  4.1 cm ascending aorta on CT scan 2017  In follow-up today, we went over his recent CT coronary calcium score   score is Zero 4.1 cm ascending aorta He is concerned about mucus plugging, wonders if this could be contributing to his shortness of breath  Main complaint for the past several visits has been exercise intolerance Walks with 65 year old gentleman on flat surface for 30-40 minutes with no significant symptoms. Still complains of exercise intolerance, progressive Frustrated he is unable to perform to his aerobic level that he was doing when he was younger.  "pissed off with afib",   Other issues include poor sleep, No motivation to travel, to go vacation or have fun Has vacation place in [place] head, has no desire to go Does not want to go to weddings Avoiding most things  chads vasc of 0 (will revisit as he gets older)  No chest pain Takes metoprolol as needed  Echo 01/2015: mild to moderate AS, bicuspid aortic valve? Details discussed with him  Total chol 195, ldl 127 Tolerating low-dose metoprolol PRN  EKG on today's visit shows atrial fibrillation with ventricular rate 66 bpm, T-wave abnormality in V5 through V6, 1 and aVL  Other past medical history  No TIA or stroke type symptoms. Tolerating aspirin  Previous significant evaluation by  cardiology for his atrial  fibrillation. Previously seen at [facility] among other institutions as well as through our group, Dr. Caryl Comes many years ago. Atrial fibrillation dates back to 1999. He's been maintained on low-dose aspirin with no TIA or stroke symptoms.  In terms of his prior family history, father had coronary artery disease, bypass surgery at age 3, died at age 28 Mother had dementia, other relatives with stroke  Echocardiogram October 2014 was reviewed. This showed normal ejection fraction with moderate MR, mild to moderate TR, mild AI, mild aortic valve stenosis with possible bicuspid aortic valve, borderline mitral valve prolapse   PMH:   has a past medical history of Atrial fibrillation ([place]); Bicuspid aortic valve; BPH (benign prostatic hypertrophy); and Heart murmur.  PSH:    Past Surgical History:  Procedure Laterality Date  . COLONOSCOPY  2008  . DOPPLER ECHOCARDIOGRAPHY  02/11/06   Nml  EF 60%  . ETT Myoview  02/16/2006   Mod inferior defect, neg ischemia  . MENISCUS REPAIR  03/1971   Medial, left knee open  . POLYPECTOMY  06/14/2001   Colon, internal hemorrhoids  . TENDON REPAIR  10 YOA   Left little finger  . TONSILLECTOMY  8 YOA  . UPPER GASTROINTESTINAL ENDOSCOPY  01/12/06    Current Outpatient Prescriptions  Medication Sig Dispense Refill  . aspirin 81 MG tablet Take 81 mg by mouth daily.      . cholecalciferol (VITAMIN D) 1000 UNITS tablet Take 2,000 Units by mouth daily.     . Coenzyme Q10 (CO Q 10) 100 MG CAPS  Take by mouth daily.    . metoprolol tartrate (LOPRESSOR) 25 MG tablet TAKE 1/2 TABLET ONCE A DAY 15 tablet 3  . Red Yeast Rice 600 MG CAPS Take 600 mg by mouth 2 (two) times daily.     No current facility-administered medications for this visit.      Allergies:   Moxifloxacin; Penicillins; and Quinolones   Social History:  The patient  reports that he quit smoking about 42 years ago. He has never used smokeless tobacco. He reports that he drinks alcohol. He  reports that he does not use drugs.   Family History:   family history includes Alzheimer's disease in his mother; Cancer in his brother; Depression in his brother and mother; Diabetes in his other; Heart disease in his paternal grandfather; Heart disease (age of onset: 54) in his father.    Review of Systems: Review of Systems  Constitutional: Positive for malaise/fatigue.  Respiratory: Positive for shortness of breath.   Cardiovascular: Positive for palpitations.  Gastrointestinal: Negative.   Musculoskeletal: Negative.   Neurological: Negative.   Psychiatric/Behavioral: Negative.   All other systems reviewed and are negative.    PHYSICAL EXAM: VS:  BP 110/80 (BP Location: Left Arm, Patient Position: Sitting, Cuff Size: Normal)   Pulse 66   Ht 5\' 10"  (1.778 m)   Wt 172 lb 8 oz (78.2 kg)   BMI 24.75 kg/m  , BMI Body mass index is 24.75 kg/m. GEN: Well nourished, well developed, in no acute distress  HEENT: normal  Neck: no JVD, carotid bruits, or masses Cardiac: Irregularly irregular,  2+ SEM RSB,  no rubs, or gallops,no edema  Respiratory:  clear to auscultation bilaterally, normal work of breathing GI: soft, nontender, nondistended, + BS MS: no deformity or atrophy  Skin: warm and dry, no rash Neuro:  Strength and sensation are intact Psych: euthymic mood, full affect    Recent Labs: 03/03/2015: ALT 26; BUN 17; Creat 0.93; Hemoglobin 15.0; Platelets 151; Potassium 4.1; Sodium 137    Lipid Panel Lab Results  Component Value Date   CHOL 195 03/03/2015   HDL 45 03/03/2015   LDLCALC 127 03/03/2015   TRIG 115 03/03/2015      Wt Readings from Last 3 Encounters:  11/05/15 172 lb 8 oz (78.2 kg)  08/25/15 174 lb 4 oz (79 kg)  06/11/15 172 lb 8 oz (78.2 kg)       ASSESSMENT AND PLAN:  Permanent atrial fibrillation (HCC) - Plan: EKG 12-Lead Heart rate relatively well controlled, tolerating anticoagulation,  Long discussion concerning risk and benefit of  anticoagulation He is indicated that he prefers to stay on aspirin at this time CHADS VASC is Currently 0 . This will increase up to 1 above age 27 , still placing him at low risk .   Hyperlipemia Currently not on a statin Total cholesterol 190s Recommended continued strict diet, exercise   CT scan does not show any significant coronary calcifications, score 0  Chronic fatigue and malaise He does do exercise several days per week Does not feel like doing anything, travel, vacation   etiology unclear, unable to exclude depression  Bicuspid aortic valve Mild to moderate aortic valve stenosis on prior echocardiogram Monitor for now  possibly could contribute to his shortness of breath symptoms  Shortness of breath, Decreased  exercise tolerance,  sx  possibly from atrial fibrillation as well as underlying mild to moderate aortic valve stenosis  No clear treatment options available for him Recommended he continue regular  exercise program,   possibly change his regiment, add some weights, different types of aerobic activity    Total encounter time more than 25 minutes  Greater than 50% was spent in counseling and coordination of care with the patient   Disposition:   F/U  12 months   Orders Placed This Encounter  Procedures  . EKG 12-Lead     Signed, Esmond Plants, M.D., Ph.D. 11/05/2015  [place], [place]

## 2015-11-10 ENCOUNTER — Encounter: Payer: Self-pay | Admitting: Internal Medicine

## 2015-11-10 DIAGNOSIS — R0609 Other forms of dyspnea: Principal | ICD-10-CM

## 2015-11-13 NOTE — Progress Notes (Signed)
[facility] Consultation      Assessment and Plan:  Lung nodule. -Groundglass nodule in the periphery of the right middle lobe 2, which could be a slight infiltrate or mucoid impaction. -We'll repeat CT scan and 3-6 months, with contrast.  Dyspnea -Suspect that this is predominantly due to cardiac valvular disease, continue follow-up with cardiology. -Pulmonary function test in 2018, before next follow-up visit, to look for pulmonary causes of dyspnea.  Excessive daytime sleepiness. -Patient notes that his A. fib gets worse when laying down flat. Discussed that this could be secondary to undiagnosed obstructive sleep apnea, and would recommend a sleep study to rule this out. -He is awaiting for his Medicare to start next year, and I will consider ordering the test at that time.  Pulmonary hypertension. -As seen on the CT chest, and previous echocardiogram.   Mitral valve prolapse with mitral valve regurgitation  Bicuspid aortic valve with mild-to-moderate stenosis.  Atrial fibrillation   Date: 11/13/2015  MRN# [id number] SKANDA DOUB 1950/03/12  Referring Physician:   KIMONI CHABAN is a 65 y.o. old male seen in consultation for chief complaint of:    Chief Complaint  Patient presents with  . pulmonary consult    per Dr. Rockey Situ. pt c/o sob with exertion & occ trouble taking deep breath.    HPI:   He had a CT calcium score which showed abnormal lung findings of possible nodules. He has noticed that his exercise capacity is down. 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 He last exercised to that level about a year or more.  Currently he is able to walk and do mild inclines, but he can not do more intense exercise, though he has been told to try to increase his exercise.   He is not sleepy during the day but he he can take a nap. He snores at night. He does not have witnessed apneas. He sleeps with a few pillows because of afib  wakes him at night if he sleeps flat. He sleeps in a recliner often.   He has been a smoker for 5 years until the age of 31, he smoked about a ppd. He has a cat at home, in bed. No diagnosed lung problems.   Review of lung CT images from cardiac scoring test 09/11/15: There are mild infiltrates in the anterior and lateral right middle lobes, there is cardiomegaly, with biatrial enlargement, particularly the right atrium, particularly in the left atrium  Review of echocardiogram from 01/24/15: EF was [id number], [place] systolic pressure is 37  PMHX:   Past Medical History:  Diagnosis Date  . Atrial fibrillation ([place])       . Bicuspid aortic valve   . BPH (benign prostatic hypertrophy)   . Heart murmur    Surgical Hx:  Past Surgical History:  Procedure Laterality Date  . COLONOSCOPY  2008  . DOPPLER ECHOCARDIOGRAPHY  02/11/06   Nml  EF 60%  . ETT Myoview  02/16/2006   Mod inferior defect, neg ischemia  . MENISCUS REPAIR  03/1971   Medial, left knee open  . POLYPECTOMY  06/14/2001   Colon, internal hemorrhoids  . TENDON REPAIR  10 YOA   Left little finger  . TONSILLECTOMY  8 YOA  . UPPER GASTROINTESTINAL ENDOSCOPY  01/12/06   Family Hx:  Family History  Problem Relation Age of Onset  . Alzheimer's disease Mother   . Depression Mother   . Heart disease  Father 31    S/P heart attack with bypass graft, SOB, retired Agricultural consultant in [place] with no significant chest sx, has had generalized pruritis  . Cancer Brother     bladder CA  . Depression Brother   . Diabetes Other   . Heart disease Paternal Grandfather     MI  . Hypertension Neg Hx   . Alcohol abuse Neg Hx   . Drug abuse Neg Hx    Social Hx:   Social History  Substance Use Topics  . Smoking status: Former Smoker    Quit date: 10/07/1973  . Smokeless tobacco: Never Used  . Alcohol use Yes     Comment: wine daily, social drinker   Medication:   Reviewed.     Allergies:  Moxifloxacin; Penicillins; and Quinolones  Review of  Systems: Gen:  Denies  fever, sweats, chills HEENT: Denies blurred vision, double vision. bleeds, sore throat Cvc:  No dizziness, chest pain. Resp:   Denies cough or sputum production, shortness of breath Gi: Denies swallowing difficulty, stomach pain. Gu:  Denies bladder incontinence, burning urine Ext:   No Joint pain, stiffness. Skin: No skin rash,  hives  Endoc:  No polyuria, polydipsia. Psych: No depression, insomnia. Other:  All other systems were reviewed with the patient and were negative other that what is mentioned in the HPI.   Physical Examination:   VS: BP 124/70 (BP Location: Left Arm, Cuff Size: Normal)   Pulse 87   Ht 5\' 10"  (1.778 m)   Wt 173 lb (78.5 kg)   SpO2 97%   BMI 24.82 kg/m   General Appearance: No distress  Neuro:without focal findings,  speech normal,  HEENT: PERRLA, EOM intact.   Pulmonary: normal breath sounds, No wheezing.  CardiovascularNormal S1,S2.  No m/r/g.   Abdomen: Benign, Soft, non-tender. Renal:  No costovertebral tenderness  GU:  No performed at this time. Endoc: No evident thyromegaly, no signs of acromegaly. Skin:   warm, no rashes, no ecchymosis  Extremities: normal, no cyanosis, clubbing.  Other findings:    LABORATORY PANEL:   CBC No results for input(s): WBC, HGB, HCT, PLT in the last 168 hours. ------------------------------------------------------------------------------------------------------------------  Chemistries  No results for input(s): NA, K, CL, CO2, GLUCOSE, BUN, CREATININE, CALCIUM, MG, AST, ALT, ALKPHOS, BILITOT in the last 168 hours.  Invalid input(s): GFRCGP ------------------------------------------------------------------------------------------------------------------  Cardiac Enzymes No results for input(s): TROPONINI in the last 168 hours. ------------------------------------------------------------  RADIOLOGY:  No results found.     Thank  you for the consultation and for allowing [facility] to assist in the care of your patient. Our recommendations are noted above.  Please contact us if we can be of further service.   Marda Stalker, MD.  Board Certified in Internal Medicine, Pulmonary Medicine, [place], and Sleep Medicine.  [facility] Office Number: [phone]  Patricia Pesa, M.D.  Vilinda Boehringer, M.D.  Merton Border, M.D  11/13/2015

## 2015-11-14 ENCOUNTER — Ambulatory Visit (INDEPENDENT_AMBULATORY_CARE_PROVIDER_SITE_OTHER): Payer: PRIVATE HEALTH INSURANCE | Admitting: Internal Medicine

## 2015-11-14 ENCOUNTER — Encounter: Payer: Self-pay | Admitting: Internal Medicine

## 2015-11-14 VITALS — BP 124/70 | HR 87 | Ht 70.0 in | Wt 173.0 lb

## 2015-11-14 DIAGNOSIS — R911 Solitary pulmonary nodule: Secondary | ICD-10-CM | POA: Diagnosis not present

## 2015-11-14 DIAGNOSIS — R0609 Other forms of dyspnea: Secondary | ICD-10-CM | POA: Diagnosis not present

## 2015-11-14 NOTE — Patient Instructions (Signed)
-  CT scan with contrast in February 2018, follow-up visit afterwards. -Pulmonary function testing in January 2018.

## 2015-12-30 ENCOUNTER — Encounter: Payer: Self-pay | Admitting: Internal Medicine

## 2015-12-30 ENCOUNTER — Encounter: Payer: Self-pay | Admitting: Cardiovascular Disease

## 2016-01-05 ENCOUNTER — Other Ambulatory Visit: Payer: Self-pay

## 2016-01-05 DIAGNOSIS — Z8249 Family history of ischemic heart disease and other diseases of the circulatory system: Secondary | ICD-10-CM

## 2016-01-16 ENCOUNTER — Ambulatory Visit: Payer: Medicare Other

## 2016-01-16 ENCOUNTER — Other Ambulatory Visit: Payer: Self-pay | Admitting: Cardiovascular Disease

## 2016-01-16 DIAGNOSIS — I712 Thoracic aortic aneurysm, without rupture, unspecified: Secondary | ICD-10-CM

## 2016-01-16 DIAGNOSIS — Z8249 Family history of ischemic heart disease and other diseases of the circulatory system: Secondary | ICD-10-CM

## 2016-04-16 ENCOUNTER — Other Ambulatory Visit: Payer: Self-pay | Admitting: Internal Medicine

## 2016-04-16 DIAGNOSIS — R0602 Shortness of breath: Secondary | ICD-10-CM

## 2016-04-29 ENCOUNTER — Telehealth: Payer: Self-pay | Admitting: Internal Medicine

## 2016-04-29 NOTE — Telephone Encounter (Signed)
Called and left multiple messages for patient to return my call to schedule PFT, CT Chest and ROV.  Pt states that he does not want to schedule any of these tests or ROV at this time. That he is being followed by his primary care physician.   Advised patient that I would send the provider a message to make him aware and that if he decided to schedule to return my call at [phone].  Phone note routed to Dr. Ashby Dawes for his information. Rhonda J Cobb

## 2016-05-03 ENCOUNTER — Encounter: Payer: Self-pay | Admitting: Internal Medicine

## 2016-05-03 ENCOUNTER — Ambulatory Visit (INDEPENDENT_AMBULATORY_CARE_PROVIDER_SITE_OTHER): Payer: Medicare Other | Admitting: Internal Medicine

## 2016-05-03 VITALS — BP 110/80 | HR 82 | Temp 98.5°F | Wt 169.0 lb

## 2016-05-03 DIAGNOSIS — M24549 Contracture, unspecified hand: Secondary | ICD-10-CM

## 2016-05-03 NOTE — Progress Notes (Signed)
Pre visit review using our clinic review tool, if applicable. No additional management support is needed unless otherwise documented below in the visit note. 

## 2016-05-03 NOTE — Progress Notes (Signed)
Subjective:    Patient ID: Don Warner, male    DOB: 1950-06-24, 66 y.o.   MRN: [id number]  HPI Here due to contractures in fingers Has left 4th and 5th pretty bad and mild in 2nd finger Right 5th finger also--there for 15 years  Saw ortho in the past--- was going to try some injection therapy  No pain  Current Outpatient Prescriptions on File Prior to Visit  Medication Sig Dispense Refill  . aspirin 81 MG tablet Take 81 mg by mouth daily.      . cholecalciferol (VITAMIN D) 1000 UNITS tablet Take 2,000 Units by mouth daily.     . Coenzyme Q10 (CO Q 10) 100 MG CAPS Take by mouth daily.    . metoprolol tartrate (LOPRESSOR) 25 MG tablet TAKE 1/2 TABLET ONCE A DAY 15 tablet 3  . Red Yeast Rice 600 MG CAPS Take 600 mg by mouth 2 (two) times daily.     No current facility-administered medications on file prior to visit.     Allergies  Allergen Reactions  . Moxifloxacin Anxiety and Other (See Comments)    Other Reaction: mms cramping, insomnia REACTION: Joint pain/disoriented  . Penicillins     REACTION: Itching and hives  . Quinolones     Joint pain, disoriented    Past Medical History:  Diagnosis Date  . Atrial fibrillation ([place])       . Bicuspid aortic valve   . BPH (benign prostatic hypertrophy)   . Heart murmur     Past Surgical History:  Procedure Laterality Date  . COLONOSCOPY  2008  . DOPPLER ECHOCARDIOGRAPHY  02/11/06   Nml  EF 60%  . ETT Myoview  02/16/2006   Mod inferior defect, neg ischemia  . MENISCUS REPAIR  03/1971   Medial, left knee open  . POLYPECTOMY  06/14/2001   Colon, internal hemorrhoids  . TENDON REPAIR  10 YOA   Left little finger  . TONSILLECTOMY  8 YOA  . UPPER GASTROINTESTINAL ENDOSCOPY  01/12/06    Family History  Problem Relation Age of Onset  . Alzheimer's disease Mother   . Depression Mother   . Heart disease Father 15    S/P heart attack with bypass graft, SOB, retired Agricultural consultant in [place] with no significant chest sx, has had  generalized pruritis  . Cancer Brother     bladder CA  . Depression Brother   . Diabetes Other   . Heart disease Paternal Grandfather     MI  . Hypertension Neg Hx   . Alcohol abuse Neg Hx   . Drug abuse Neg Hx     Social History   Social History  . Marital status: Married    Spouse name: N/A  . Number of children: 3  . Years of education: N/A   Occupational History  . [place] Engineer, structural     retired  . personal trainer     Now retired   Social History Main Topics  . Smoking status: Former Smoker    Quit date: 10/07/1973  . Smokeless tobacco: Never Used  . Alcohol use Yes     Comment: wine daily, social drinker  . Drug use: No  . Sexual activity: Not on file   Other Topics Concern  . Not on file   Social History Narrative  . 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

## 2016-05-03 NOTE — Assessment & Plan Note (Addendum)
This is fairly severe and causes some functional impairment Discussed options---the hand specialist has something that he injects I recommended that he consider trying this--or even surgery should that be needed Will set up second opinion

## 2016-05-16 ENCOUNTER — Encounter: Payer: Self-pay | Admitting: Internal Medicine

## 2016-05-19 ENCOUNTER — Other Ambulatory Visit: Payer: Self-pay | Admitting: Cardiovascular Disease

## 2016-06-14 DIAGNOSIS — M720 Palmar fascial fibromatosis [Dupuytren]: Secondary | ICD-10-CM | POA: Diagnosis not present

## 2016-06-17 ENCOUNTER — Encounter: Payer: Self-pay | Admitting: Cardiovascular Disease

## 2016-10-01 ENCOUNTER — Telehealth: Payer: Self-pay | Admitting: Cardiovascular Disease

## 2016-10-01 NOTE — Telephone Encounter (Signed)
Called patient's home and stated call could not be completed as dialed.  Attempted 3 times.  Then left a message on the patient's mobile number to return my call regarding a letter received.

## 2016-10-09 ENCOUNTER — Other Ambulatory Visit: Payer: Self-pay | Admitting: Cardiovascular Disease

## 2016-10-26 ENCOUNTER — Telehealth: Payer: Self-pay | Admitting: Cardiovascular Disease

## 2016-10-26 NOTE — Telephone Encounter (Signed)
Pt calling just to let us know he won't be coming back to the office He is having issues with [facility] bililng He wanted Dr Rockey Situ to know it is not with him just another office he is having issues with  Will call back to reschedule his appointment once this is resolved

## 2016-10-27 NOTE — Telephone Encounter (Signed)
Error

## 2016-10-29 ENCOUNTER — Ambulatory Visit: Payer: Medicare Other | Admitting: Cardiovascular Disease

## 2016-12-06 ENCOUNTER — Other Ambulatory Visit: Payer: Self-pay | Admitting: Cardiovascular Disease

## 2016-12-06 DIAGNOSIS — M720 Palmar fascial fibromatosis [Dupuytren]: Secondary | ICD-10-CM | POA: Diagnosis not present

## 2017-01-19 ENCOUNTER — Other Ambulatory Visit: Payer: Self-pay | Admitting: Cardiovascular Disease

## 2017-01-19 ENCOUNTER — Telehealth: Payer: Self-pay | Admitting: Cardiovascular Disease

## 2017-01-19 NOTE — Telephone Encounter (Signed)
Patient states [facility] on [address] has denied his refill of metoprolol 25 MG Offered to schedule a follow up visit but he did not want to schedule at this time Please call to discuss

## 2017-01-19 NOTE — Telephone Encounter (Signed)
I left a message for the patient to call. 

## 2017-01-20 NOTE — Telephone Encounter (Signed)
No answer. Left message to call back.   

## 2017-01-24 NOTE — Telephone Encounter (Signed)
I left a message for the patient to call at his "preferred" number.

## 2017-03-08 DIAGNOSIS — I341 Nonrheumatic mitral (valve) prolapse: Secondary | ICD-10-CM | POA: Diagnosis not present

## 2017-03-08 DIAGNOSIS — Q231 Congenital insufficiency of aortic valve: Secondary | ICD-10-CM | POA: Diagnosis not present

## 2017-03-08 DIAGNOSIS — J309 Allergic rhinitis, unspecified: Secondary | ICD-10-CM | POA: Diagnosis not present

## 2017-03-08 DIAGNOSIS — E782 Mixed hyperlipidemia: Secondary | ICD-10-CM | POA: Diagnosis not present

## 2017-03-08 DIAGNOSIS — Z125 Encounter for screening for malignant neoplasm of prostate: Secondary | ICD-10-CM | POA: Diagnosis not present

## 2017-03-08 DIAGNOSIS — I482 Chronic atrial fibrillation: Secondary | ICD-10-CM | POA: Diagnosis not present

## 2017-04-12 DIAGNOSIS — E782 Mixed hyperlipidemia: Secondary | ICD-10-CM | POA: Diagnosis not present

## 2017-04-12 DIAGNOSIS — Z125 Encounter for screening for malignant neoplasm of prostate: Secondary | ICD-10-CM | POA: Diagnosis not present

## 2017-05-09 DIAGNOSIS — K219 Gastro-esophageal reflux disease without esophagitis: Secondary | ICD-10-CM | POA: Diagnosis not present

## 2017-05-09 DIAGNOSIS — Z0000 Encounter for general adult medical examination without abnormal findings: Secondary | ICD-10-CM | POA: Diagnosis not present

## 2017-05-09 DIAGNOSIS — E782 Mixed hyperlipidemia: Secondary | ICD-10-CM | POA: Diagnosis not present

## 2017-05-09 DIAGNOSIS — I341 Nonrheumatic mitral (valve) prolapse: Secondary | ICD-10-CM | POA: Diagnosis not present

## 2017-05-09 DIAGNOSIS — J309 Allergic rhinitis, unspecified: Secondary | ICD-10-CM | POA: Diagnosis not present

## 2017-05-09 DIAGNOSIS — J301 Allergic rhinitis due to pollen: Secondary | ICD-10-CM | POA: Diagnosis not present

## 2017-05-09 DIAGNOSIS — R42 Dizziness and giddiness: Secondary | ICD-10-CM | POA: Diagnosis not present

## 2017-05-09 DIAGNOSIS — R065 Mouth breathing: Secondary | ICD-10-CM | POA: Diagnosis not present

## 2017-05-09 DIAGNOSIS — I482 Chronic atrial fibrillation: Secondary | ICD-10-CM | POA: Diagnosis not present

## 2017-05-09 DIAGNOSIS — G4733 Obstructive sleep apnea (adult) (pediatric): Secondary | ICD-10-CM | POA: Diagnosis not present

## 2017-05-09 DIAGNOSIS — R0982 Postnasal drip: Secondary | ICD-10-CM | POA: Diagnosis not present

## 2017-05-13 DIAGNOSIS — B029 Zoster without complications: Secondary | ICD-10-CM | POA: Diagnosis not present

## 2017-05-17 DIAGNOSIS — I482 Chronic atrial fibrillation: Secondary | ICD-10-CM | POA: Diagnosis not present

## 2017-05-17 DIAGNOSIS — I341 Nonrheumatic mitral (valve) prolapse: Secondary | ICD-10-CM | POA: Diagnosis not present

## 2017-05-17 DIAGNOSIS — Q231 Congenital insufficiency of aortic valve: Secondary | ICD-10-CM | POA: Diagnosis not present

## 2017-05-17 DIAGNOSIS — E782 Mixed hyperlipidemia: Secondary | ICD-10-CM | POA: Diagnosis not present

## 2017-05-17 DIAGNOSIS — J309 Allergic rhinitis, unspecified: Secondary | ICD-10-CM | POA: Diagnosis not present

## 2017-06-08 ENCOUNTER — Encounter: Payer: Self-pay | Admitting: Internal Medicine

## 2017-06-08 ENCOUNTER — Telehealth: Payer: Self-pay | Admitting: Internal Medicine

## 2017-06-08 NOTE — Telephone Encounter (Signed)
3 attempts to schedule fu appt from recall list.   Deleting recall.  °Mailed Letter  °

## 2017-08-09 DIAGNOSIS — D2261 Melanocytic nevi of right upper limb, including shoulder: Secondary | ICD-10-CM | POA: Diagnosis not present

## 2017-08-09 DIAGNOSIS — D2272 Melanocytic nevi of left lower limb, including hip: Secondary | ICD-10-CM | POA: Diagnosis not present

## 2017-08-09 DIAGNOSIS — D2262 Melanocytic nevi of left upper limb, including shoulder: Secondary | ICD-10-CM | POA: Diagnosis not present

## 2017-08-09 DIAGNOSIS — D225 Melanocytic nevi of trunk: Secondary | ICD-10-CM | POA: Diagnosis not present

## 2017-08-09 DIAGNOSIS — D2271 Melanocytic nevi of right lower limb, including hip: Secondary | ICD-10-CM | POA: Diagnosis not present

## 2017-09-02 DIAGNOSIS — Q231 Congenital insufficiency of aortic valve: Secondary | ICD-10-CM | POA: Diagnosis not present

## 2017-09-02 DIAGNOSIS — I77810 Thoracic aortic ectasia: Secondary | ICD-10-CM | POA: Diagnosis not present

## 2017-09-02 DIAGNOSIS — I341 Nonrheumatic mitral (valve) prolapse: Secondary | ICD-10-CM | POA: Diagnosis not present

## 2017-09-02 DIAGNOSIS — J309 Allergic rhinitis, unspecified: Secondary | ICD-10-CM | POA: Diagnosis not present

## 2017-09-02 DIAGNOSIS — I482 Chronic atrial fibrillation: Secondary | ICD-10-CM | POA: Diagnosis not present

## 2017-09-02 DIAGNOSIS — E782 Mixed hyperlipidemia: Secondary | ICD-10-CM | POA: Diagnosis not present

## 2017-09-02 DIAGNOSIS — R001 Bradycardia, unspecified: Secondary | ICD-10-CM | POA: Diagnosis not present

## 2017-09-02 DIAGNOSIS — R5383 Other fatigue: Secondary | ICD-10-CM | POA: Diagnosis not present

## 2017-09-09 DIAGNOSIS — S30861A Insect bite (nonvenomous) of abdominal wall, initial encounter: Secondary | ICD-10-CM | POA: Diagnosis not present

## 2017-09-09 DIAGNOSIS — S30860A Insect bite (nonvenomous) of lower back and pelvis, initial encounter: Secondary | ICD-10-CM | POA: Diagnosis not present

## 2017-09-09 DIAGNOSIS — S70362A Insect bite (nonvenomous), left thigh, initial encounter: Secondary | ICD-10-CM | POA: Diagnosis not present

## 2017-09-19 DIAGNOSIS — I482 Chronic atrial fibrillation: Secondary | ICD-10-CM | POA: Diagnosis not present

## 2017-09-19 DIAGNOSIS — Q231 Congenital insufficiency of aortic valve: Secondary | ICD-10-CM | POA: Diagnosis not present

## 2017-10-21 DIAGNOSIS — J029 Acute pharyngitis, unspecified: Secondary | ICD-10-CM | POA: Diagnosis not present

## 2017-10-22 IMAGING — CT CT HEART SCORING
2 series · 16 of 20 positions shown, 18 images · non-contrast
Comparison: No priors.

CLINICAL DATA: Risk stratification

EXAM:
Coronary Calcium Score
TECHNIQUE: The patient was scanned on a Siemens Somatom 64 slice scanner. Axial
non-contrast 3 mm slices were carried out through the heart. The
data set was analyzed on a dedicated work station and scored using
the Agatson method.

[Series 2: casc 3.0 i36f 2 bestdiast 79 % · axial · 0.42mm/px · z∈[+1210,+1320]mm · 8 of 49 slices shown, 10 images]
[im 6/49  vessel]
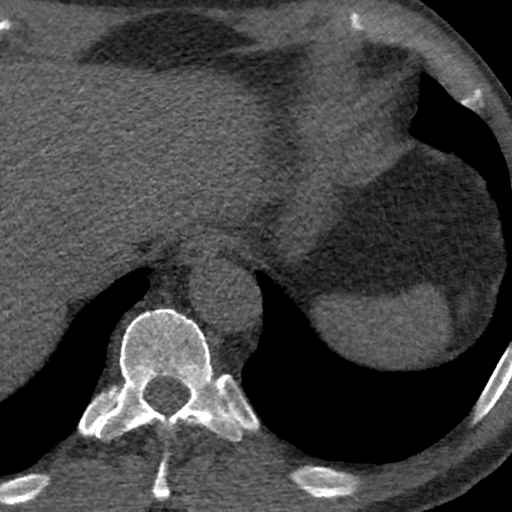
[im 6/49  lung]
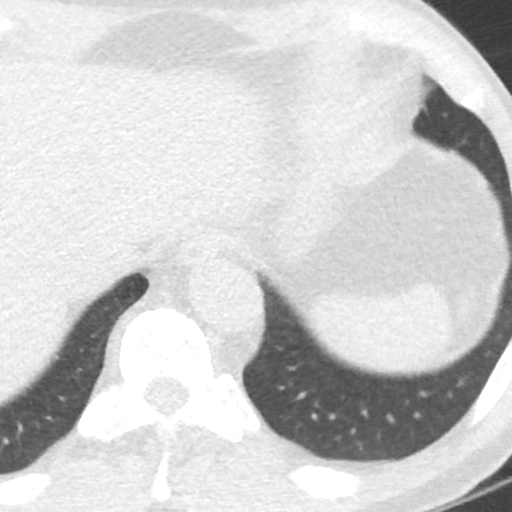
[im 11/49  vessel]
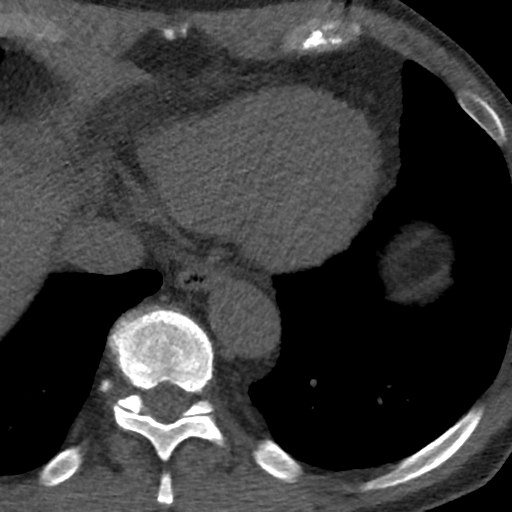
[im 17/49  vessel]
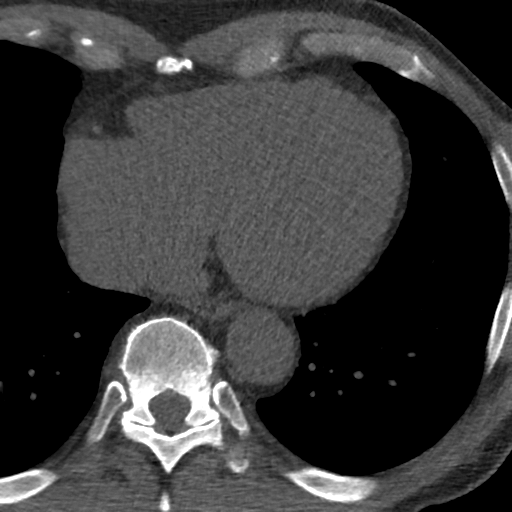
[im 22/49  vessel]
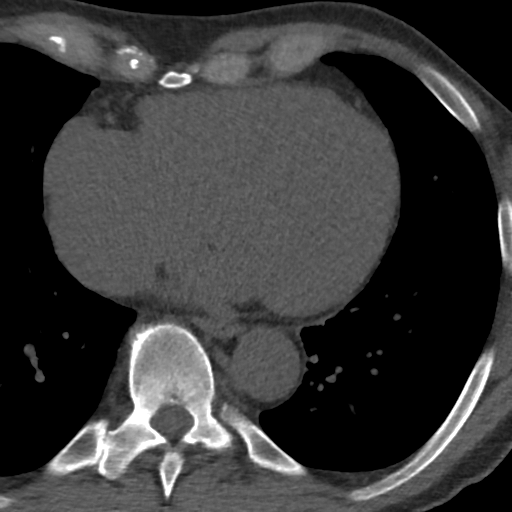
[im 27/49  vessel]
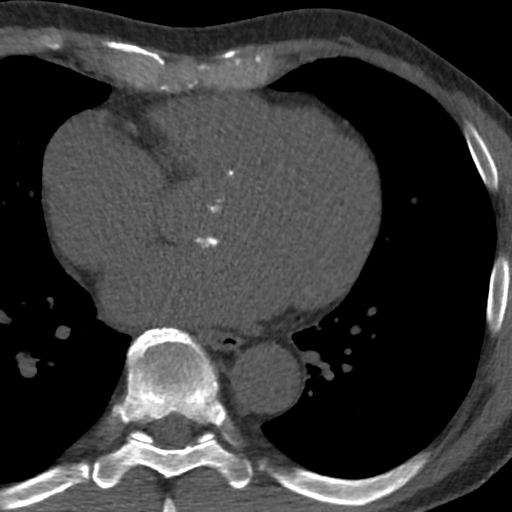
[im 27/49  lung]
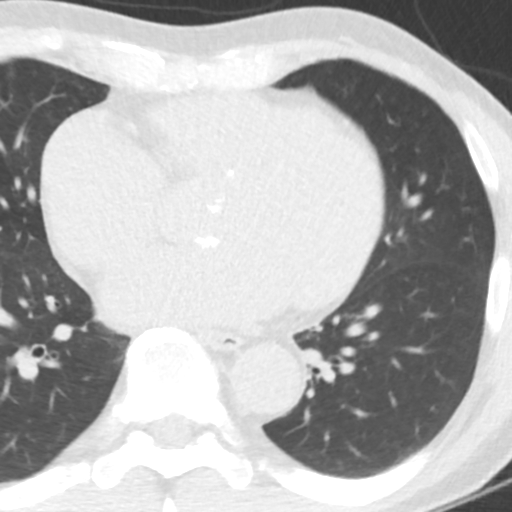
[im 33/49  vessel]
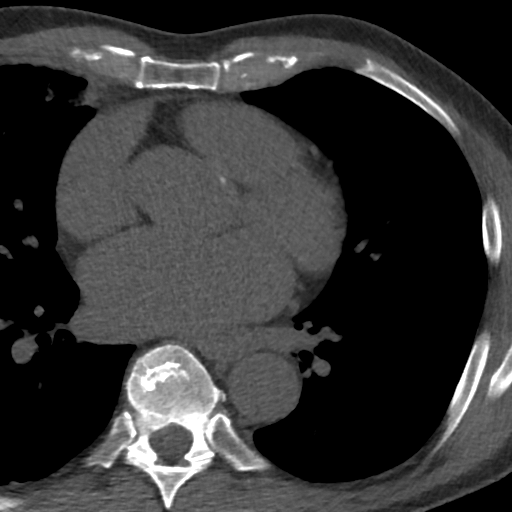
[im 38/49  vessel]
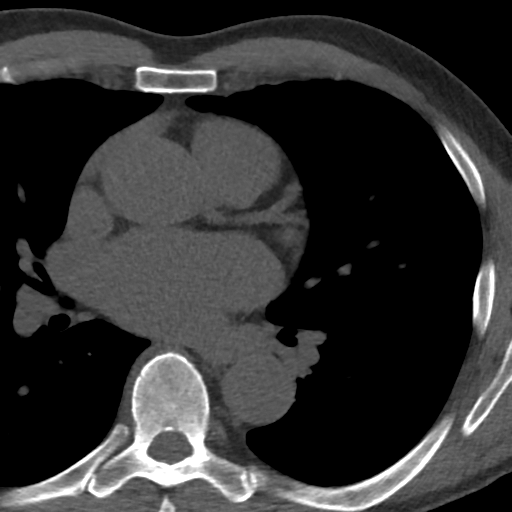
[im 43/49  vessel]
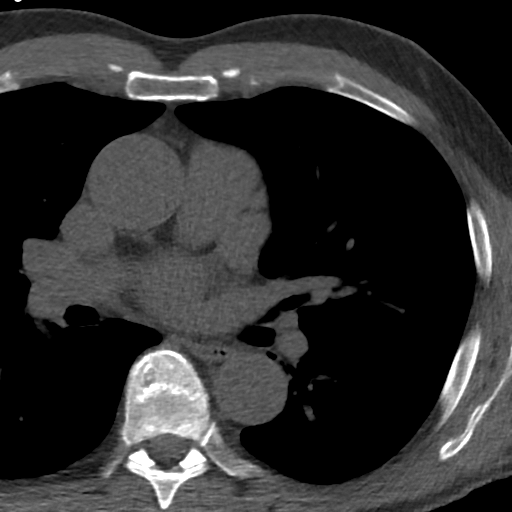

[Series 4: lung st 79 % · axial · 0.42mm/px · z∈[+1210,+1318]mm · 8 of 48 slices shown]
[im 6/48  lung]
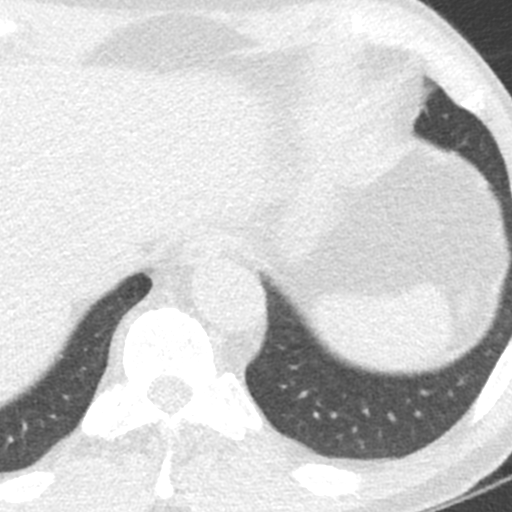
[im 11/48  lung]
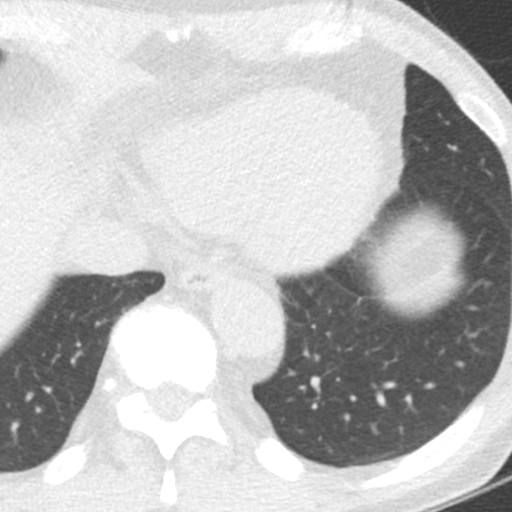
[im 16/48  lung]
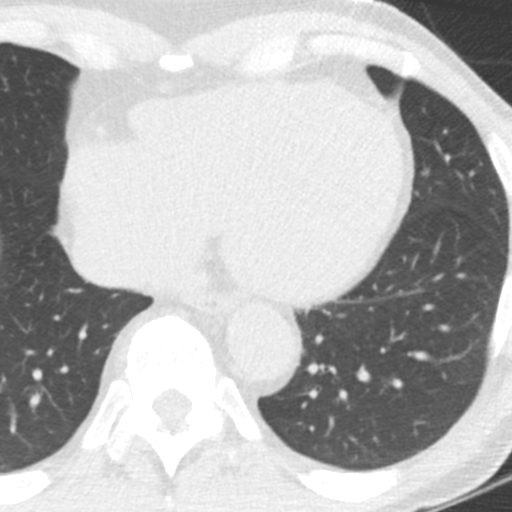
[im 21/48  lung]
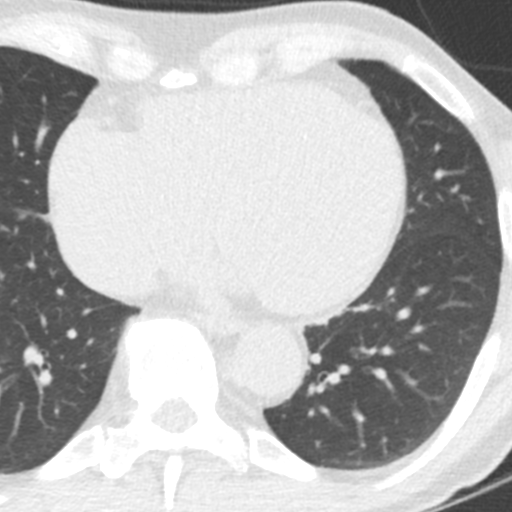
[im 27/48  lung]
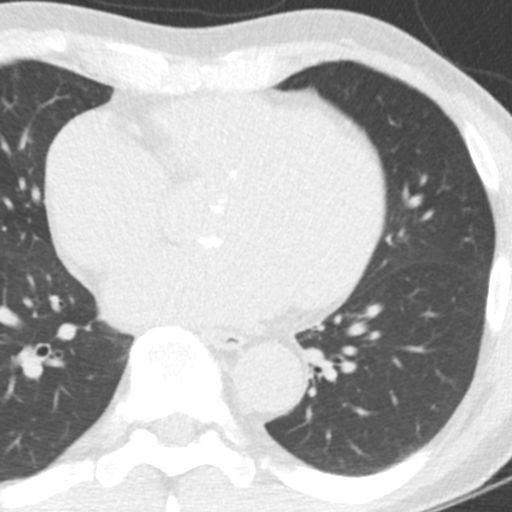
[im 32/48  lung]
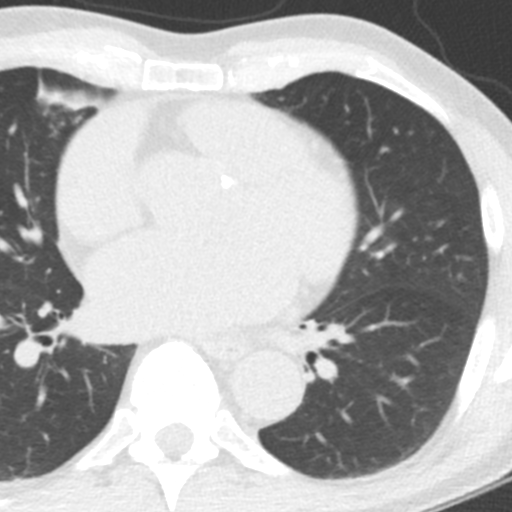
[im 37/48  lung]
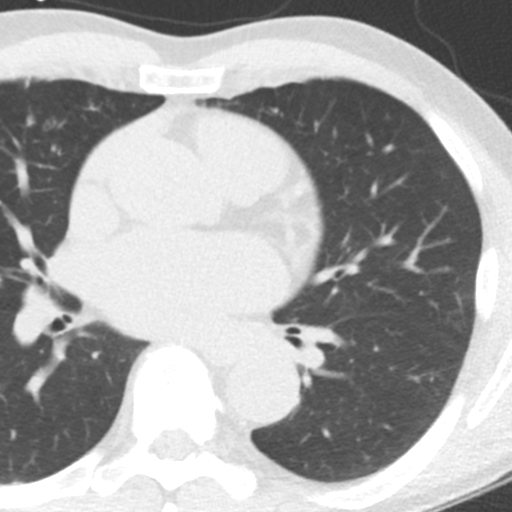
[im 42/48  lung]
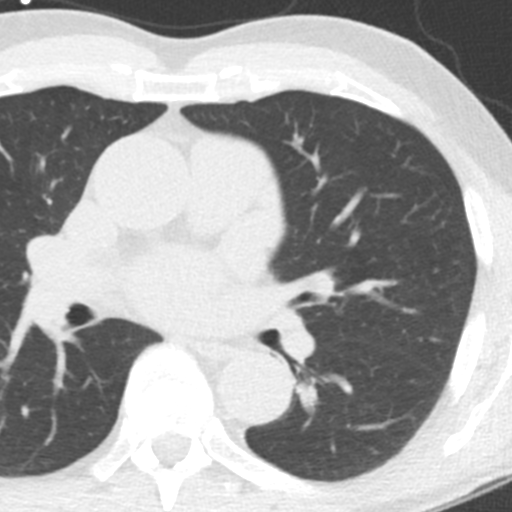

[16 of 20 positions shown; findings below may reference images not displayed]

FINDINGS: Non-cardiac: See separate report from [REDACTED].

Ascending Aorta: Normal size. There appears to be extensive
calcification in the aortic valve.

Pericardium: Normal.

Coronary arteries:  Normal origin.
IMPRESSION: 1. Coronary calcium score of 0. This was 0 percentile for age and
sex matched control.

2. There appears to be extensive calcification in the aortic valve.
A correlation with echocardiogram is recommended.

Vitaly Pino

EXAM:
OVER-READ INTERPRETATION  CT CHEST

The following report is an over-read performed by radiologist Dr.
over-read does not include interpretation of cardiac or coronary
anatomy or pathology. The coronary calcium score interpretation by
the cardiologist is attached.
FINDINGS: Small amount of clustered micronodularity in the periphery of the
right middle lobe, where there is some associated mild peripheral
bronchiolectasis most compatible with areas of mucoid impaction
within terminal bronchioles. Within the visualized portions of the
thorax there are no other larger more suspicious appearing pulmonary
nodules or masses, there is no acute consolidative airspace disease,
no pleural effusions, no pneumothorax and no lymphadenopathy.
Extensive calcifications of the aortic valve. Ectasia of the
ascending thoracic aorta (4.1 cm in diameter). Visualized portions
of the upper abdomen are unremarkable. There are no aggressive
appearing lytic or blastic lesions noted in the visualized portions
of the skeleton.
IMPRESSION: 1. There are extensive calcifications of the aortic valve.
Echocardiographic correlation for evaluation of potential valvular
dysfunction may be warranted if clinically indicated.
2. Ectasia of the ascending thoracic aorta (4.1 cm in diameter).
Recommend annual imaging followup by CTA or MRA. This recommendation
follows 8484 ACCF/AHA/AATS/ACR/ASA/SCA/LASSECLENE/SIMILIEN/BAMBALAN/FORWARD Guidelines
for the Diagnosis and Management of Patients with Thoracic Aortic
Disease. Circulation. 8484; 121: [id number].
3. Clustered areas of peribronchovascular micronodularity in the
right middle lobe, most compatible with areas of chronic mucoid
impaction within terminal bronchioles.

## 2017-11-11 DIAGNOSIS — R0982 Postnasal drip: Secondary | ICD-10-CM | POA: Diagnosis not present

## 2017-11-11 DIAGNOSIS — M549 Dorsalgia, unspecified: Secondary | ICD-10-CM | POA: Diagnosis not present

## 2017-12-09 DIAGNOSIS — Z23 Encounter for immunization: Secondary | ICD-10-CM | POA: Diagnosis not present

## 2017-12-09 DIAGNOSIS — S91331A Puncture wound without foreign body, right foot, initial encounter: Secondary | ICD-10-CM | POA: Diagnosis not present

## 2018-05-19 ENCOUNTER — Telehealth: Payer: Self-pay

## 2018-05-19 NOTE — Telephone Encounter (Signed)
Called patient from Recall.  No answer. LMOV.  Need to schedule an Evisit.

## 2018-05-26 ENCOUNTER — Telehealth: Payer: Self-pay

## 2018-05-26 NOTE — Telephone Encounter (Signed)
Called patient from recall list,  No answer. LMOV.  Patient is past due for a follow up appointment.

## 2018-06-02 NOTE — Telephone Encounter (Signed)
Called patient from recall.  No answer. LMOV.  This is the 3rd attempt per recall list.  Will delete recall.
